# Patient Record
Sex: Male | Born: 1955 | Hispanic: No | Marital: Married | Smoking: Former smoker
Health system: Southern US, Community
[De-identification: ages and names within clinical notes are randomized; demographics above are authoritative.]

## PROBLEM LIST (undated history)

## (undated) DIAGNOSIS — G8929 Other chronic pain: Secondary | ICD-10-CM

## (undated) DIAGNOSIS — G4700 Insomnia, unspecified: Secondary | ICD-10-CM

## (undated) DIAGNOSIS — F32A Depression, unspecified: Secondary | ICD-10-CM

## (undated) DIAGNOSIS — F419 Anxiety disorder, unspecified: Secondary | ICD-10-CM

## (undated) DIAGNOSIS — F329 Major depressive disorder, single episode, unspecified: Secondary | ICD-10-CM

## (undated) DIAGNOSIS — R739 Hyperglycemia, unspecified: Secondary | ICD-10-CM

## (undated) DIAGNOSIS — M5441 Lumbago with sciatica, right side: Secondary | ICD-10-CM

## (undated) DIAGNOSIS — R413 Other amnesia: Secondary | ICD-10-CM

## (undated) DIAGNOSIS — I10 Essential (primary) hypertension: Secondary | ICD-10-CM

## (undated) DIAGNOSIS — E039 Hypothyroidism, unspecified: Secondary | ICD-10-CM

## (undated) HISTORY — DX: Depression, unspecified: F32.A

## (undated) HISTORY — DX: Essential (primary) hypertension: I10

## (undated) HISTORY — DX: Hypothyroidism, unspecified: E03.9

## (undated) HISTORY — DX: Lumbago with sciatica, right side: M54.41

## (undated) HISTORY — DX: Major depressive disorder, single episode, unspecified: F32.9

## (undated) HISTORY — DX: Other chronic pain: G89.29

## (undated) HISTORY — DX: Other amnesia: R41.3

## (undated) HISTORY — DX: Hyperglycemia, unspecified: R73.9

## (undated) HISTORY — DX: Insomnia, unspecified: G47.00

## (undated) HISTORY — DX: Anxiety disorder, unspecified: F41.9

---

## 2010-05-16 ENCOUNTER — Encounter
Admission: RE | Admit: 2010-05-16 | Discharge: 2010-05-16 | Payer: Self-pay | Source: Home / Self Care | Attending: Infectious Diseases | Admitting: Infectious Diseases

## 2013-04-25 ENCOUNTER — Encounter (HOSPITAL_COMMUNITY): Payer: Self-pay | Admitting: Emergency Medicine

## 2013-04-25 ENCOUNTER — Emergency Department (HOSPITAL_COMMUNITY)
Admission: EM | Admit: 2013-04-25 | Discharge: 2013-04-25 | Disposition: A | Discharge status: Home / Self Care | Payer: Medicaid Other | Attending: Emergency Medicine | Admitting: Emergency Medicine

## 2013-04-25 DIAGNOSIS — R220 Localized swelling, mass and lump, head: Secondary | ICD-10-CM | POA: Insufficient documentation

## 2013-04-25 DIAGNOSIS — Z87891 Personal history of nicotine dependence: Secondary | ICD-10-CM | POA: Insufficient documentation

## 2013-04-25 DIAGNOSIS — R599 Enlarged lymph nodes, unspecified: Secondary | ICD-10-CM | POA: Insufficient documentation

## 2013-04-25 DIAGNOSIS — K1379 Other lesions of oral mucosa: Secondary | ICD-10-CM

## 2013-04-25 DIAGNOSIS — H9209 Otalgia, unspecified ear: Secondary | ICD-10-CM | POA: Insufficient documentation

## 2013-04-25 DIAGNOSIS — K089 Disorder of teeth and supporting structures, unspecified: Secondary | ICD-10-CM | POA: Insufficient documentation

## 2013-04-25 DIAGNOSIS — J029 Acute pharyngitis, unspecified: Secondary | ICD-10-CM | POA: Insufficient documentation

## 2013-04-25 MED ORDER — PENICILLIN V POTASSIUM 500 MG PO TABS: 500.0000 mg | ORAL_TABLET | Freq: Three times a day (TID) | ORAL | Status: DC

## 2013-04-25 MED ORDER — HYDROCODONE-ACETAMINOPHEN 5-325 MG PO TABS: 1.0000 | ORAL_TABLET | Freq: Four times a day (QID) | ORAL | Status: DC | PRN

## 2013-04-25 NOTE — ED Notes (Signed)
Pt is here with right lower tooth pain, pain in left ear and hurts with biting for 3 days.  Reports sore throat too

## 2013-04-25 NOTE — ED Provider Notes (Signed)
CSN: [id number]     Arrival date & time 04/25/13  4098 History  This chart was scribed for non-physician practitioner Fayrene Helper, PA-C working with Laray Anger, DO by Leone Payor, ED Scribe. This patient was seen in room TR09C/TR09C and the patient's care was started at [phone].    Chief Complaint  Patient presents with  . Dental Pain    The history is provided by a relative (nephew). No language interpreter was used.    HPI Comments: Shawn Casey is a 57 y.o. male who presents to the Emergency Department complaining of 4 days of gradual onset, gradually worsening, constant right lower mouth pain. He has associated right ear pain and sore throat. His throat pain is worse with swallowing food but does not have difficulty doing this. He states the mouth pain is worse with applied pressure. He has not tried any OTC medications for these symptoms. He denies rhinorrhea, sneezing, fever, appetite change, chest pain, SOB, abdominal pain, nausea, vomiting, diarrhea.   History reviewed. No pertinent past medical history. History reviewed. No pertinent past surgical history. No family history on file. History  Substance Use Topics  . Smoking status: Former Games developer  . Smokeless tobacco: Not on file  . Alcohol Use: No    Review of Systems  Constitutional: Negative for fever and appetite change.  HENT: Positive for dental problem and sore throat. Negative for rhinorrhea and sneezing.   Respiratory: Negative for shortness of breath.   Cardiovascular: Negative for chest pain.  Gastrointestinal: Negative for nausea, vomiting and abdominal pain.    Allergies  Review of patient's allergies indicates no known allergies.  Home Medications  No current outpatient prescriptions on file. BP 151/90  Pulse 69  Temp(Src) 97.9 F (36.6 C) (Oral)  Resp 22  Ht 5' (1.524 m)  Wt 125 lb 14.4 oz (57.108 kg)  BMI 24.59 kg/m2  SpO2 96% Physical Exam  Nursing note and vitals  reviewed. Constitutional: He is oriented to person, place, and time. He appears well-developed and well-nourished.  HENT:  Head: Normocephalic and atraumatic.  Mouth/Throat: Uvula is midline and oropharynx is clear and moist. No trismus in the jaw. No oropharyngeal exudate, posterior oropharyngeal edema, posterior oropharyngeal erythema or tonsillar abscesses.  Partial partials to lower dentition. Mild edema noted to the right lower jaw with mild tenderness. No trismus, no obvious deep tissue infection in mouth.   Cardiovascular: Normal rate, regular rhythm and normal heart sounds.   Pulmonary/Chest: Effort normal and breath sounds normal. No respiratory distress. He has no wheezes. He has no rales.  Abdominal: He exhibits no distension.  Lymphadenopathy:    He has cervical adenopathy (minimal ).  Neurological: He is alert and oriented to person, place, and time.  Skin: Skin is warm and dry.  Psychiatric: He has a normal mood and affect.    ED Course  Procedures   DIAGNOSTIC STUDIES: Oxygen Saturation is 96% on RA, adequate by my interpretation.    COORDINATION OF CARE: 11:00 AM Return precautions given to nephew. Discussed treatment plan with pt at bedside and pt agreed to plan.   11:29 AM Pt has R jaw pain and mild edema.  Has poor dentition without obvious abscess, no trismus. Has partials with a sharp metal wire that can cause mucosal injury.  I suspect this may cause infection. Is afebrile, able to tolerates PO.  Plan to prescribe abx, pain medication, referral to oral surgeon as needed.  Return precaution discussed.    Labs Review Labs  Reviewed - No data to display Imaging Review No results found.  EKG Interpretation   None       MDM   1. Mouth pain    BP 151/90  Pulse 69  Temp(Src) 97.9 F (36.6 C) (Oral)  Resp 22  Ht 5' (1.524 m)  Wt 125 lb 14.4 oz (57.108 kg)  BMI 24.59 kg/m2  SpO2 96%  I personally performed the services described in this documentation,  which was scribed in my presence. The recorded information has been reviewed and is accurate.     Fayrene Helper, PA-C 04/25/13 1131

## 2013-04-25 NOTE — ED Provider Notes (Signed)
Medical screening examination/treatment/procedure(s) were performed by non-physician practitioner and as supervising physician I was immediately available for consultation/collaboration.  EKG Interpretation   None         Lataya Varnell M Honesty Menta, DO 04/25/13 1709 

## 2013-04-25 NOTE — ED Notes (Signed)
Pt c/o pain in right lower gum area and right ear.

## 2013-11-12 ENCOUNTER — Encounter (HOSPITAL_COMMUNITY): Payer: Self-pay | Admitting: Emergency Medicine

## 2013-11-12 ENCOUNTER — Emergency Department (HOSPITAL_COMMUNITY)
Admission: EM | Admit: 2013-11-12 | Discharge: 2013-11-12 | Disposition: A | Discharge status: Home / Self Care | Payer: Medicaid Other | Source: Home / Self Care | Attending: Family Medicine | Admitting: Family Medicine

## 2013-11-12 DIAGNOSIS — J069 Acute upper respiratory infection, unspecified: Secondary | ICD-10-CM

## 2013-11-12 DIAGNOSIS — M5430 Sciatica, unspecified side: Secondary | ICD-10-CM

## 2013-11-12 DIAGNOSIS — M5431 Sciatica, right side: Secondary | ICD-10-CM

## 2013-11-12 MED ORDER — TRAMADOL HCL 50 MG PO TABS: 50.0000 mg | ORAL_TABLET | Freq: Four times a day (QID) | ORAL | Status: DC | PRN

## 2013-11-12 MED ORDER — PREDNISONE 5 MG PO KIT: PACK | ORAL | Status: DC

## 2013-11-12 NOTE — ED Provider Notes (Signed)
Shawn Casey is a 58 y.o. male who presents to Urgent Care today for right low back pain radiating to the leg. This is been present for the last few days. No weakness or numbness bowel bladder dysfunction or difficulty walking. Patient also notes a mild cough and sore throat. No fevers or chills nausea vomiting or diarrhea. No trouble swallowing or breathing. No medications tried yet. No history of back pain. No injury history.   History reviewed. No pertinent past medical history. History  Substance Use Topics  . Smoking status: Former Research scientist (life sciences)  . Smokeless tobacco: Not on file  . Alcohol Use: No   ROS as above Medications: No current facility-administered medications for this encounter.   Current Outpatient Prescriptions  Medication Sig Dispense Refill  . PredniSONE 5 MG KIT 12 day Dosepak by mouth  1 kit  0  . traMADol (ULTRAM) 50 MG tablet Take 1 tablet (50 mg total) by mouth every 6 (six) hours as needed.  15 tablet  0    Exam:  BP 131/74  Pulse 60  Temp(Src) 98.5 F (36.9 C) (Oral)  Resp 14  SpO2 100% Gen: Well NAD HEENT: EOMI,  MMM posterior pharynx with cobblestoning. Tympanic membranes are normal appearing bilaterally. Lungs: Normal work of breathing. CTABL Heart: RRR no MRG Abd: NABS, Soft. NT, ND Exts: Brisk capillary refill, warm and well perfused.  Back: Nontender to spinal midline paraspinals or SI joints. Positive pretzel stretch on the right. Negative straight leg and Faber test bilaterally Neck range of motion is normal and intact. Largely strength is normal throughout. Reflexes are equal bilateral lower extremities. Normal gait.  No results found for this or any previous visit (from the past 24 hour(s)). No results found.  Assessment and Plan: 58 y.o. male with    sciatica. Plan to treat with prednisone dosepak. Additionally his tramadol for pain control as needed. Patient also has a viral URI. Watchful waiting. Followup with primary care  provider. Discussed warning signs or symptoms. Please see discharge instructions. Patient expresses understanding.    Gregor Hams, MD 11/12/13 1452

## 2013-11-12 NOTE — ED Notes (Signed)
C/o lower left sided back pain.  On set yesterday.  Denies urinary symptoms and injury.  No otc meds taken for symptoms.    Also c/o  Dry non productive cough, fever, and sore throat x 1 wk.

## 2013-11-12 NOTE — Discharge Instructions (Signed)
Thank you for coming in today. Come back or go to the emergency room if you notice new weakness new numbness problems walking or bowel or bladder problems. Do not take tramadol and drive or operate machinery  Sciatica Sciatica is pain, weakness, numbness, or tingling along the path of the sciatic nerve. The nerve starts in the lower back and runs down the back of each leg. The nerve controls the muscles in the lower leg and in the back of the knee, while also providing sensation to the back of the thigh, lower leg, and the sole of your foot. Sciatica is a symptom of another medical condition. For instance, nerve damage or certain conditions, such as a herniated disk or bone spur on the spine, pinch or put pressure on the sciatic nerve. This causes the pain, weakness, or other sensations normally associated with sciatica. Generally, sciatica only affects one side of the body. CAUSES   Herniated or slipped disc.  Degenerative disk disease.  A pain disorder involving the narrow muscle in the buttocks (piriformis syndrome).  Pelvic injury or fracture.  Pregnancy.  Tumor (rare). SYMPTOMS  Symptoms can vary from mild to very severe. The symptoms usually travel from the low back to the buttocks and down the back of the leg. Symptoms can include:  Mild tingling or dull aches in the lower back, leg, or hip.  Numbness in the back of the calf or sole of the foot.  Burning sensations in the lower back, leg, or hip.  Sharp pains in the lower back, leg, or hip.  Leg weakness.  Severe back pain inhibiting movement. These symptoms may get worse with coughing, sneezing, laughing, or prolonged sitting or standing. Also, being overweight may worsen symptoms. DIAGNOSIS  Your caregiver will perform a physical exam to look for common symptoms of sciatica. He or she may ask you to do certain movements or activities that would trigger sciatic nerve pain. Other tests may be performed to find the cause of  the sciatica. These may include:  Blood tests.  X-rays.  Imaging tests, such as an MRI or CT scan. TREATMENT  Treatment is directed at the cause of the sciatic pain. Sometimes, treatment is not necessary and the pain and discomfort goes away on its own. If treatment is needed, your caregiver may suggest:  Over-the-counter medicines to relieve pain.  Prescription medicines, such as anti-inflammatory medicine, muscle relaxants, or narcotics.  Applying heat or ice to the painful area.  Steroid injections to lessen pain, irritation, and inflammation around the nerve.  Reducing activity during periods of pain.  Exercising and stretching to strengthen your abdomen and improve flexibility of your spine. Your caregiver may suggest losing weight if the extra weight makes the back pain worse.  Physical therapy.  Surgery to eliminate what is pressing or pinching the nerve, such as a bone spur or part of a herniated disk. HOME CARE INSTRUCTIONS   Only take over-the-counter or prescription medicines for pain or discomfort as directed by your caregiver.  Apply ice to the affected area for 20 minutes, 3 4 times a day for the first 48 72 hours. Then try heat in the same way.  Exercise, stretch, or perform your usual activities if these do not aggravate your pain.  Attend physical therapy sessions as directed by your caregiver.  Keep all follow-up appointments as directed by your caregiver.  Do not wear high heels or shoes that do not provide proper support.  Check your mattress to see if  it is too soft. A firm mattress may lessen your pain and discomfort. SEEK IMMEDIATE MEDICAL CARE IF:   You lose control of your bowel or bladder (incontinence).  You have increasing weakness in the lower back, pelvis, buttocks, or legs.  You have redness or swelling of your back.  You have a burning sensation when you urinate.  You have pain that gets worse when you lie down or awakens you at  night.  Your pain is worse than you have experienced in the past.  Your pain is lasting longer than 4 weeks.  You are suddenly losing weight without reason. MAKE SURE YOU:  Understand these instructions.  Will watch your condition.  Will get help right away if you are not doing well or get worse. Document Released: 05/20/2001 Document Revised: 11/25/2011 Document Reviewed: 10/05/2011 [facility] Patient Information 2014 [employer], [place].

## 2014-02-23 ENCOUNTER — Encounter (HOSPITAL_COMMUNITY): Payer: Self-pay | Admitting: Emergency Medicine

## 2014-02-23 ENCOUNTER — Emergency Department (HOSPITAL_COMMUNITY)
Admission: EM | Admit: 2014-02-23 | Discharge: 2014-02-23 | Disposition: A | Discharge status: Nursing Home (Includes ICF) | Payer: Self-pay | Source: Home / Self Care | Attending: Emergency Medicine | Admitting: Emergency Medicine

## 2014-02-23 ENCOUNTER — Observation Stay (HOSPITAL_COMMUNITY)
Admission: EM | Admit: 2014-02-23 | Discharge: 2014-02-24 | Disposition: A | Discharge status: Home / Self Care | Payer: Self-pay | Attending: Internal Medicine | Admitting: Internal Medicine

## 2014-02-23 ENCOUNTER — Emergency Department (HOSPITAL_COMMUNITY): Payer: Self-pay

## 2014-02-23 DIAGNOSIS — R079 Chest pain, unspecified: Secondary | ICD-10-CM

## 2014-02-23 DIAGNOSIS — R0789 Other chest pain: Principal | ICD-10-CM | POA: Insufficient documentation

## 2014-02-23 DIAGNOSIS — Z87891 Personal history of nicotine dependence: Secondary | ICD-10-CM | POA: Insufficient documentation

## 2014-02-23 DIAGNOSIS — L509 Urticaria, unspecified: Secondary | ICD-10-CM

## 2014-02-23 DIAGNOSIS — R072 Precordial pain: Secondary | ICD-10-CM

## 2014-02-23 LAB — CBC WITH DIFFERENTIAL/PLATELET
BASOS ABS: 0 10*3/uL (ref 0.0–0.1)
Basophils Relative: 0 % (ref 0–1)
EOS PCT: 0 % (ref 0–5)
Eosinophils Absolute: 0 10*3/uL (ref 0.0–0.7)
HCT: 40.3 % (ref 39.0–52.0)
HEMOGLOBIN: 13.7 g/dL (ref 13.0–17.0)
LYMPHS ABS: 1.1 10*3/uL (ref 0.7–4.0)
Lymphocytes Relative: 20 % (ref 12–46)
MCH: 27.5 pg (ref 26.0–34.0)
MCHC: 34 g/dL (ref 30.0–36.0)
MCV: 80.8 fL (ref 78.0–100.0)
MONO ABS: 0.2 10*3/uL (ref 0.1–1.0)
MONOS PCT: 3 % (ref 3–12)
NEUTROS ABS: 4 10*3/uL (ref 1.7–7.7)
Neutrophils Relative %: 77 % (ref 43–77)
Platelets: 148 10*3/uL — ABNORMAL LOW (ref 150–400)
RBC: 4.99 MIL/uL (ref 4.22–5.81)
RDW: 13 % (ref 11.5–15.5)
WBC: 5.2 10*3/uL (ref 4.0–10.5)

## 2014-02-23 LAB — I-STAT CHEM 8, ED
BUN: 10 mg/dL (ref 6–23)
CHLORIDE: 109 meq/L (ref 96–112)
Calcium, Ion: 1.12 mmol/L — ABNORMAL LOW (ref 1.13–1.30)
Creatinine, Ser: 1.1 mg/dL (ref 0.50–1.35)
Glucose, Bld: 88 mg/dL (ref 70–99)
HCT: 41 % (ref 39.0–52.0)
HEMOGLOBIN: 13.9 g/dL (ref 13.0–17.0)
POTASSIUM: 3.8 meq/L (ref 3.7–5.3)
Sodium: 142 mEq/L (ref 137–147)
TCO2: 23 mmol/L (ref 0–100)

## 2014-02-23 LAB — I-STAT TROPONIN, ED: Troponin i, poc: 0 ng/mL (ref 0.00–0.08)

## 2014-02-23 LAB — TROPONIN I: Troponin I: <0.3 ng/mL (ref <0.30)

## 2014-02-23 MED ORDER — DIPHENHYDRAMINE HCL 25 MG PO CAPS
50.0000 mg | ORAL_CAPSULE | Freq: Once | ORAL | Status: AC
  Administered 2014-02-23: 50 mg via ORAL

## 2014-02-23 MED ORDER — SODIUM CHLORIDE 0.9 % IV SOLN
INTRAVENOUS | Status: DC
  Administered 2014-02-23: 11:00:00 via INTRAVENOUS

## 2014-02-23 MED ORDER — RANITIDINE HCL 150 MG/10ML PO SYRP
150.0000 mg | ORAL_SOLUTION | Freq: Two times a day (BID) | ORAL | Status: DC
  Administered 2014-02-23 – 2014-02-24 (×2): 150 mg via ORAL
  Filled 2014-02-23 (×3): qty 10

## 2014-02-23 MED ORDER — DIPHENHYDRAMINE HCL 25 MG PO CAPS
ORAL_CAPSULE | ORAL | Status: AC
  Filled 2014-02-23: qty 2

## 2014-02-23 MED ORDER — INFLUENZA VAC SPLIT QUAD 0.5 ML IM SUSY
0.5000 mL | PREFILLED_SYRINGE | INTRAMUSCULAR | Status: DC
  Filled 2014-02-23: qty 0.5

## 2014-02-23 MED ORDER — ASPIRIN 81 MG PO CHEW
324.0000 mg | CHEWABLE_TABLET | Freq: Once | ORAL | Status: AC
  Administered 2014-02-23: 324 mg via ORAL

## 2014-02-23 MED ORDER — GI COCKTAIL ~~LOC~~
30.0000 mL | Freq: Once | ORAL | Status: AC
  Administered 2014-02-23: 30 mL via ORAL
  Filled 2014-02-23: qty 30

## 2014-02-23 MED ORDER — CETIRIZINE HCL 5 MG/5ML PO SYRP
5.0000 mg | ORAL_SOLUTION | Freq: Every day | ORAL | Status: DC
  Administered 2014-02-23 – 2014-02-24 (×2): 5 mg via ORAL
  Filled 2014-02-23 (×2): qty 5

## 2014-02-23 MED ORDER — ASPIRIN 81 MG PO CHEW
CHEWABLE_TABLET | ORAL | Status: AC
  Filled 2014-02-23: qty 4

## 2014-02-23 MED ORDER — NITROGLYCERIN 0.4 MG SL SUBL
0.4000 mg | SUBLINGUAL_TABLET | SUBLINGUAL | Status: AC | PRN
  Administered 2014-02-23: 0.4 mg via SUBLINGUAL

## 2014-02-23 MED ORDER — NITROGLYCERIN 0.4 MG SL SUBL
SUBLINGUAL_TABLET | SUBLINGUAL | Status: AC
  Filled 2014-02-23: qty 1

## 2014-02-23 MED ORDER — HEPARIN SODIUM (PORCINE) 5000 UNIT/ML IJ SOLN
5000.0000 [IU] | Freq: Three times a day (TID) | INTRAMUSCULAR | Status: DC
  Administered 2014-02-23 – 2014-02-24 (×3): 5000 [IU] via SUBCUTANEOUS
  Filled 2014-02-23 (×5): qty 1

## 2014-02-23 MED ORDER — ASPIRIN EC 81 MG PO TBEC
81.0000 mg | DELAYED_RELEASE_TABLET | Freq: Every day | ORAL | Status: DC
  Administered 2014-02-24: 81 mg via ORAL
  Filled 2014-02-23: qty 1

## 2014-02-23 MED ORDER — METHYLPREDNISOLONE SODIUM SUCC 125 MG IJ SOLR
60.0000 mg | Freq: Once | INTRAMUSCULAR | Status: AC
  Administered 2014-02-23: 60 mg via INTRAVENOUS
  Filled 2014-02-23: qty 0.96

## 2014-02-23 NOTE — ED Notes (Signed)
Pt  Has   A  Rash  Red  Raised    Lesions     On  Torso    That  Itch  He  Also  Reports  Chest  Pain     He  Is  Awake  Alert   And  Oriented

## 2014-02-23 NOTE — H&P (Signed)
Date: 02/23/2014               Patient Name:  Shawn Casey MRN: [id number]  DOB: 1956/04/09 Age / Sex: 58 y.o., male   PCP: No Pcp Per Patient              Medical Service: Internal Medicine Teaching Service              Attending Physician: Dr. Burns Spain, MD    First Contact: Juliane Lack, MS 4 Pager: [phone]  Second Contact: Dr. Sherrine Maples Pager: [phone]  Third Contact  Pager:        After Hours (After 5p/  First Contact Pager: [phone]  weekends / holidays): Second Contact Pager: [phone]   Chief Complaint: Chief Complaint  Patient presents with  . Urticaria  . Chest Pain   Chest pain  HPI: Shawn Casey is a 51 [place] man (moved to [place] 2011, doesn't speak english, son can translate) who presents with episode of exertional CP yesterday and diffuse extremity rash x1d. Reports that yesterday had onset of upper and lower extremity itching with associated "swelling." Denies localized raised areas, endorses redness only where he scratched but otherwise no rash noted. Denies known allergies. Denies any difficulty with breathing, tongue swelling, lip swelling. Has been slowly resolving and notes that it is the best that is has been at time of this writer's interview.  Also concerning to him recently was an episode of chest pain. 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 Reports that he was exerting himself and had onset of achy pain at the base of his sternum that did not radiate, associated with dizziness, sweating, and shortness of breath. Sat down to rest and pain reportedly got worse over first 15 min then eased off and was gone by 30 min along with resolving dizziness and SOB. Denies history of this pain in the past. Reports that he also had some intermittent sharp chest pains after that episode with deep breathing. Denies history of MI or cardiac issues. Has no other medical problems. At  time of this interview, denies any chest pain.  Allergies: Review of patient's allergies indicates no known allergies.  Medications:  Current Facility-Administered Medications  Medication Dose Route Frequency Provider Last Rate Last Dose  . [START ON 02/24/2014] aspirin EC tablet 81 mg  81 mg Oral Daily Genelle Gather, MD      . cetirizine HCl (Zyrtec) 5 MG/5ML syrup 5 mg  5 mg Oral Daily Genelle Gather, MD   5 mg at 02/23/14 1802  . heparin injection 5,000 Units  5,000 Units Subcutaneous 3 times per day Genelle Gather, MD   5,000 Units at 02/23/14 1802  . [START ON 02/24/2014] Influenza vac split quadrivalent PF (FLUARIX) injection 0.5 mL  0.5 mL Intramuscular Tomorrow-1000 Burns Spain, MD      . ranitidine (ZANTAC) 150 MG/10ML syrup 150 mg  150 mg Oral BID Genelle Gather, MD   150 mg at 02/23/14 1801    Medical History: History reviewed. No pertinent past medical history.  Surgical History: History reviewed. No pertinent past surgical history.  Social History: History   Social History  . Marital Status: Married    Spouse Name: N/A    Number of Children: N/A  . Years of Education: N/A   Occupational History  . Not on file.   Social History Main Topics  . Smoking  status: Former Games developer  . Smokeless tobacco: Not on file  . Alcohol Use: No  . Drug Use: No  . Sexual Activity: Not Currently   Other Topics Concern  . Not on file   Social History Narrative  . No narrative on file    Family History: History reviewed. No pertinent family history.  Review of Systems: 10 systems reviewed and are negative unless otherwise mentioned in HPI.  Labs/Studies: labs and studies reviewed per EMR.   CBC    Component Value Date/Time   WBC 5.2 02/23/2014 1237   RBC 4.99 02/23/2014 1237   HGB 13.9 02/23/2014 1337   HCT 41.0 02/23/2014 1337   PLT 148* 02/23/2014 1237   MCV 80.8 02/23/2014 1237   MCH 27.5 02/23/2014 1237   MCHC 34.0 02/23/2014 1237   RDW 13.0 02/23/2014 1237     LYMPHSABS 1.1 02/23/2014 1237   MONOABS 0.2 02/23/2014 1237   EOSABS 0.0 02/23/2014 1237   BASOSABS 0.0 02/23/2014 1237     Physical Exam: General: 58 year old man laying on bed wearing beanie on head in NAD, son and wife at bedside Skin: localized slightly erythematous wheels on back and posterior aspect of arm HEENT: NCAT, EOMI Cardiovascular: no TTP along chest wall, pulse regular rate and rhythm, normal S1S2, no murmurs appreciated Pulmonary: Scant bibasilar fine crackles that clear mostly after a few deep inspirations, otherwise clear, normal depth and effort, normal WOB Abdomen: Soft, NTND, +BS, no masses appreciated  Extremities: Warm and well perfused, no clubbing/cyanosis/edema Neuro: CN III-XII grossly intact, spontaneous movement of all limbs, no obvious deficits  Assessment/Plan:  Principal Problem:   Chest pain Active Problems:   Urticaria   Shawn Casey is a 58 y.o. male with no reported PMH that p/w 1 day itching extremities associated with swelling and some redness as well as episode of chest pain.   Chest pain: Pt description of pain fulfills criteria for unstable angina. Was exertional, substernal, associated with dyspnea, and relieved with rest. See HPI for full description. Trops negative x1 ED, EKG without signs of ischemia. Location of pain is inferior sternum, however could also be GI in nature, received GI cocktail in ED.  - tele - f/u cycled trops q6 x3  Urticaria of unknown origin: Pt reports no known allergies. Reports one day of extremity itchiness with "swelling," but no rash, wheals; only red where pt scratched. Unlikely that this is related to pts CP. Began to resolve s/p benadryl at urgent care. On exam, found to have some erythematous circular wheals on back and posterior R upper arm.  - Zyretc  daily  - Zantac  BID  - Solumedrol     This is a Psychologist, occupational Note.  The care of the patient was discussed with Dr. Sherrine Maples  and the assessment and plan was formulated with their assistance.  Please see their note for official documentation of the patient encounter.

## 2014-02-23 NOTE — ED Provider Notes (Signed)
CSN: [id number]     Arrival date & time 02/23/14  1201 History   First MD Initiated Contact with Patient 02/23/14 1208     No chief complaint on file.    (Consider location/radiation/quality/duration/timing/severity/associated sxs/prior Treatment) HPI  58 year old [place] male who was sent here from urgent care for evaluations of chest pain. Patient speaks Nepali. History obtained through [facility] phone interpreter. Patient is a poor historian, difficult to obtain history. [place]. He works night shift. Last night around 5 PM patient developed an urticarial rash follows with chest discomfort and headache. Report CP worse with exertion but no associated SOB, diaphoresis, lightheadedness. He continues finishing his night shift and subsequently went to urgent care for evaluation. As far as he knows he does not recall any new environmental exposure, change in medication, soap or detergent. He was seen at urgent care earlier today and did received Benadryl, in addition to sublingual nitroglycerin and aspirin. Medication did improve his rash but he was sent here for further evaluation of his chest pain. Patient unable to describe his chest pain but point to his mid chest. He denies any shortness of breath diaphoresis nausea vomiting, lightheadedness, dizziness that is associated with his chest pain. He cannot tell me if he had similar pain in the past. He is a nonsmoker and denies any significant cardiac history. Patient does not take any medication on a regular basis. At this time he still endorsed some chest pain but states that has improved. Pain is been ongoing for more than 12 hours.  No past medical history on file. No past surgical history on file. No family history on file. History  Substance Use Topics  . Smoking status: Former Research scientist (life sciences)  . Smokeless tobacco: Not on file  . Alcohol Use: No    Review of Systems  All other systems  reviewed and are negative.     Allergies  Review of patient's allergies indicates no known allergies.  Home Medications   Prior to Admission medications   Medication Sig Start Date End Date Taking? Authorizing Provider  PredniSONE 5 MG KIT 12 day Dosepak by mouth 11/12/13   Gregor Hams, MD  traMADol (ULTRAM) 50 MG tablet Take 1 tablet (50 mg total) by mouth every 6 (six) hours as needed. 11/12/13   Gregor Hams, MD   There were no vitals taken for this visit. Physical Exam  Constitutional: He is oriented to person, place, and time. He appears well-developed and well-nourished. No distress.  HENT:  Head: Atraumatic.  Mouth/Throat: Oropharynx is clear and moist.  No mucosal edema or airway compromise  Eyes: Conjunctivae are normal.  Eyelids are mildly edematous  Neck: Normal range of motion. Neck supple.  Cardiovascular: Normal rate and regular rhythm.   Pulmonary/Chest: Effort normal and breath sounds normal. He has no wheezes. He exhibits no tenderness.  Abdominal: Soft. There is no tenderness.  Musculoskeletal: He exhibits no edema.  Neurological: He is alert and oriented to person, place, and time.  Skin: No rash (Faint urticarial rash throughout body but improving) noted.  Psychiatric: He has a normal mood and affect.    ED Course  Procedures (including critical care time)  12:45 PM Patient here with nonspecific chest pain corresponding with his urticarial rash that is improving. Pain is atypical for ACS, however given his age and a poor historian, work up initiated.    2:03 PM Patient has a  normal EKG, negative troponin and currently chest pain-free. Chest x-ray, and labs are reassuring. Pain is atypical for ACS. Care discussed with Dr. Betsey Holiday.  Plan to ambulate pt and if he's pain free, will d/c pt with outpt f/u.    2:33 PM When ambulating pt reports having mild chest discomfort, but sxs free while resting.  No c/o SOB.  I have consulted with Internal medicine  resident, who agrees to see pt and will admit for cp rule out.     Labs Review Labs Reviewed  CBC WITH DIFFERENTIAL - Abnormal; Notable for the following:    Platelets 148 (*)    All other components within normal limits  I-STAT CHEM 8, ED - Abnormal; Notable for the following:    Calcium, Ion 1.12 (*)    All other components within normal limits  Randolm Idol, ED    Imaging Review Dg Chest 2 View  02/23/2014   CLINICAL DATA:  Chest pain.  EXAM: CHEST  2 VIEW  COMPARISON:  05/16/2010  FINDINGS: The cardiomediastinal silhouette is within normal limits. The lungs are well inflated and clear. There is no evidence of pleural effusion or pneumothorax. No acute osseous abnormality is identified.  IMPRESSION: No active cardiopulmonary disease.   Electronically Signed   By: Logan Bores   On: 02/23/2014 13:45     EKG Interpretation   Date/Time:  Thursday February 23 2014 12:25:48 EDT Ventricular Rate:  76 PR Interval:  140 QRS Duration: 80 QT Interval:  349 QTC Calculation: 392 R Axis:   55 Text Interpretation:  Sinus rhythm Normal ECG Confirmed by POLLINA  MD,  CHRISTOPHER ([id number]) on 02/23/2014 12:29:52 PM      MDM   Final diagnoses:  Urticaria  Exertional chest pain    BP 90/60  Pulse 60  Temp(Src) 98.4 F (36.9 C) (Oral)  Resp 18  SpO2 96%  I have reviewed nursing notes and vital signs. I personally reviewed the imaging tests through PACS system  I reviewed available ER/hospitalization records thought the EMR     Domenic Moras, [place] 02/23/14 1434

## 2014-02-23 NOTE — ED Notes (Signed)
20  Angio    r  Arm  1  Att     Site  patent

## 2014-02-23 NOTE — H&P (Signed)
Date: 02/23/2014               Patient Name:  Shawn Casey MRN: [id number]  DOB: 10/29/1955 Age / Sex: 58 y.o., male   PCP: No Pcp Per Patient         Medical Service: Internal Medicine Teaching Service         Attending Physician: Dr. Burns Spain, MD    First Contact: Juliane Lack, MS-IV Pager: [phone]  Second Contact: Dr. Sherrine Maples Pager: [phone]       After Hours (After 5p/  First Contact Pager: [phone]  weekends / holidays): Second Contact Pager: [phone]   Chief Complaint: Urticaria and chest pain  History of Present Illness:  Pt is a 58yo M w/ no PMH who presents to the ED from Urgent Care with hives and an episode of chest pain. His son is present and speaks fluent [place] and is able to translate for the patient who is from [place] and does not speak [place]. 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 He is unable to qualify the pain, but states that he sat down and the pain and dizziness resolved after 10-15 minutes. He denies any further pain since that time; however ED documentation states that the patient was having chest pain in the ED while ambulating to the bathroom.   Per the son, when the patient came home from work around [employer], he was in his normal state of health. However, when he awoke this morning he was itching all over with swelling in his BUE and BLE, denied hives, but did feel as though his throat was swelling. He was taken to Urgent Care by his family where he was noted to have hives but no edema or respiratory symptoms. He was given Benadryl, ASA, NTG and then transferred to [facility] in stable condition. Once in the ED, his itching had almost resolved. The patient and the family deny any chemical exposures, changes in detergents or soaps, or changes in po consumption.    Meds: No  current facility-administered medications for this encounter.   No current outpatient prescriptions on file.    Allergies: Allergies as of 02/23/2014  . (No Known Allergies)   History reviewed. No pertinent past medical history. History reviewed. No pertinent past surgical history. History reviewed. No pertinent family history. History   Social History  . Marital Status: Married    Spouse Name: N/A    Number of Children: N/A  . Years of Education: N/A   Occupational History  . Not on file.   Social History Main Topics  . Smoking status: Former Games developer  . Smokeless tobacco: Not on file  . Alcohol Use: No  . Drug Use: No  . Sexual Activity: Not on file   Other Topics Concern  . Not on file   Social History Narrative  . No narrative on file    Review of Systems: A 12 point ROS was performed; pertinent positives and negatives were noted in the HPI   Physical Exam: Blood pressure 118/71, pulse 64, temperature 98.4 F (36.9 C), temperature source Oral, resp. rate 16, SpO2 96.00%. General: Alert & oriented x 3, well-developed, and cooperative on examination.  Head: Normocephalic and atraumatic.  Eyes: Vision grossly intact, pupils equal, round, and reactive to light  Neck: Supple, full ROM,  no JVD.  Lungs: CTAB, normal respiratory effort, no accessory muscle use, no crackles, and no wheezes. Heart: Regular rate, regular rhythm, no murmur, no gallop, and no rub.  Abdomen: Soft, non-tender, non-distended, normal bowel sounds, no guarding, no rebound tenderness, no organomegaly.  Msk: No joint swelling, warmth, or erythema.  Extremities: No peripheral edema Neurologic: Alert & oriented X3, cranial nerves II-XII intact, strength normal in all extremities, sensation intact. Gait not observed Skin: 2 5mm raised, erythematous lesions on left lateral back with erythema extending across the lower back fields. 1cm raised erythematous area on right posterior arm superior to elbow.    Psych: Memory intact for recent and remote. Poor eye contact.   Lab results: Basic Metabolic Panel:  Recent Labs  53/66/44 1337  NA 142  K 3.8  CL 109  GLUCOSE 88  BUN 10  CREATININE 1.10   CBC:  Recent Labs  02/23/14 1237 02/23/14 1337  WBC 5.2  --   NEUTROABS 4.0  --   HGB 13.7 13.9  HCT 40.3 41.0  MCV 80.8  --   PLT 148*  --     Imaging results:  Dg Chest 2 View  02/23/2014   CLINICAL DATA:  Chest pain.  EXAM: CHEST  2 VIEW  COMPARISON:  05/16/2010  FINDINGS: The cardiomediastinal silhouette is within normal limits. The lungs are well inflated and clear. There is no evidence of pleural effusion or pneumothorax. No acute osseous abnormality is identified.  IMPRESSION: No active cardiopulmonary disease.   Electronically Signed   By: Sebastian Ache   On: 02/23/2014 13:45    Other results: EKG: Normal sinus rhythm. No priors for comparison.  Assessment & Plan by Problem: Pt is a 58yo M w/ no PMH who presents to the ED from Urgent Care with hives and an episode of chest pain and urticaria.  Chest pain: 1 episode of chest pain yesterday that resolved with rest after 10-15 minutes and was associated with dizziness. Reported chest pain with ambulation in the ED, but with the patient's language barrier, unsure if he was actually experiencing chest pain, as he told us, through his son, that he has had no more pain since yesterday. EKG in normal sinus rhythm without any ST changes. Troponin negative x1. Pain location seems to be more in midepigastrium and could be related to GERD, although the patient denies any GI symptoms. A GI cocktail was administered in the ED. Possible that the pain could be related to his urticaria/allergic reaction, but it did occur hours before the onset of his skin manifestations. IMTS asked to admit for chest pain rule out - Admit to IMTS to telemetry - Troponins q6 x3 - Vitals per unit protocol - Regular diet  Urticaria, possibly idiopathic: Pt awoke  with itching across torso, back and extremtiies. Pt and family deny any hives, but do endorse swelling in bilateral upper and lower extremities. Pt also reports swelling in the throat. He presented to Urgent Care where hives were documented on exam and was given Benadryl  x1, ASA, and NTG (no report of chest pain at the Urgent Care). In the ED, patient with few scattered urticarial lesions on left back and R arm. Denies any current respiratory symptoms.  - Start Zyretc  daily - Start Zantac  BID - Start Solumedrol  x1 to help prevent biphasic reaction  DVT PPx: [place] Heparin    Dispo: Disposition is deferred at this time, awaiting improvement of current medical problems. Anticipated discharge in approximately  1-2 day(s).   The patient does not have a current PCP (No Pcp Per Patient) and may need an [facility] hospital follow-up appointment after discharge.  The patient does not have transportation limitations that hinder transportation to clinic appointments.  Signed: Genelle Gather, MD 02/23/2014, 4:16 PM

## 2014-02-23 NOTE — ED Notes (Signed)
Pt reporting chest pain when ambulating to the restroom.

## 2014-02-23 NOTE — ED Provider Notes (Signed)
Chief Complaint   Rash   History of Present Illness    Shawn Casey is a 58 year old male who was at work last night when he developed a generalized urticarial rash on his face, trunk, and extremities, sparing the palms and soles. He does not have any mucosal lesions. This is very itchy. At the same time he developed substernal chest pain without radiation. The pain has persisted up until today as have the hives. He denies any shortness of breath, coughing, or wheezing. He's had no nausea or diaphoresis. He has no prior history of chest pain and denies any risk factors although he is a former smoker.  Review of Systems    Other than noted above, the patient denies any of the following symptoms. Systemic:  No fever or chills. Pulmonary:  No cough, wheezing, shortness of breath, sputum production, hemoptysis. Cardiac:  No palpitations, rapid heartbeat, dizziness, presyncope or syncope. GI:  No abdominal pain, heartburn, nausea, or vomiting. Ext:  No leg pain or swelling.  PMFSH    Past medical history, family history, social history, meds, and allergies were reviewed.   Physical Exam     Vital signs:  BP 134/78  Pulse 80  Temp(Src) 98.6 F (37 C) (Oral)  Resp 18  SpO2 100% Gen:  Alert, oriented, in no distress, skin warm and dry. ENT:  Mucous membranes moist, pharynx clear. Neck:  Supple, no adenopathy or tenderness.  No JVD. Lungs:  Clear to auscultation, no wheezes, rales or rhonchi.  No respiratory distress. Heart:  Regular rhythm.  No gallops, murmers, clicks or rubs. Chest:  No chest wall tenderness. Abdomen:  Soft, nontender, no organomegaly or mass.  Bowel sounds normal.  No pulsatile abdominal mass or bruit. Ext:  No edema.  No calf tenderness and Homann's sign negative.  Pulses full and equal. Skin:  Warm and dry.  He has a generalized, urticarial rash over his entire body.  EKG Results:  Date: 02/23/2014  Rate: 85  Rhythm: normal sinus rhythm  QRS Axis: normal--7  Intervals: normal--QTc interval 392 ms  ST/T Wave abnormalities: normal  Conduction Disutrbances:none  Narrative Interpretation: Normal sinus rhythm, normal EKG  Old EKG Reviewed: none available    Course in Urgent Care Center   The following medications were given:  Medications  aspirin chewable tablet 324 mg (not administered)  nitroGLYCERIN (NITROSTAT) SL tablet 0.4 mg (not administered)  0.9 %  sodium chloride infusion (not administered)  diphenhydrAMINE (BENADRYL) capsule 50 mg (not administered)                                                                                                                                      Assessment     The primary encounter diagnosis was Urticaria. A diagnosis of Chest pain, unspecified chest pain type was also pertinent to this visit.  Differential diagnosis is acute coronary syndrome, pulmonary embolism, ruptured aneurysm, pneumothorax, Boerhaave  syndrome, pericarditis, musculoskeletal pain, or reflux esophagitis.   Plan     The patient was transferred to the ED via EMS in stable condition.  Medical Decision Making:  58 year old [place] male has a history of hives since last night.  No new foods or meds.  No difficulty breathing.  He also has substernal chest pain since last night.  No shortness of breath, nausea, or diaphoresis.  No prior history of heart disease or risk factors.  We  Have given PO Benadryl plus TNG and ASA and will transfer via [facility].       Reuben Likes, MD 02/23/14 1106

## 2014-02-23 NOTE — ED Notes (Signed)
Patient transported to X-ray 

## 2014-02-23 NOTE — ED Notes (Signed)
Pt here from urgent care with c/o urticaria and cp. Pt speaks no english, and will answer yes to all questions. Pt is alert at this time. VSS, NAD noted.

## 2014-02-23 NOTE — Discharge Instructions (Signed)
We have determined that your problem requires further evaluation in the emergency department.  We will take care of your transport there.  Once at the emergency department, you will be evaluated by a provider and they will order whatever treatment or tests they deem necessary.  We cannot guarantee that they will do any specific test or do any specific treatment.  ° °

## 2014-02-23 NOTE — Progress Notes (Signed)
Pt denies C/P on arrival to floor.

## 2014-02-23 NOTE — ED Provider Notes (Signed)
Medical screening examination/treatment/procedure(s) were conducted as a shared visit with non-physician practitioner(s) and myself.  I personally evaluated the patient during the encounter.   EKG Interpretation   Date/Time:  Thursday February 23 2014 12:25:48 EDT Ventricular Rate:  76 PR Interval:  140 QRS Duration: 80 QT Interval:  349 QTC Calculation: 392 R Axis:   55 Text Interpretation:  Sinus rhythm Normal ECG Confirmed by Kennan Detter  MD,  Camyah Pultz [phone]) on 02/23/2014 12:29:52 PM      Present to the ER for evaluation of acute onset of rash and chest pain, both of which began last night. Patient had first noticed an itchy rash diffusely. No obvious are identified. He then developed chest pain that has been exertional in nature. He did have resolution of pain at one point in the ER, but after he got up and ambulated pain returned. Based on this, will admit for further workup.  Gilda Crease, MD 02/23/14 [phone]

## 2014-02-24 DIAGNOSIS — I369 Nonrheumatic tricuspid valve disorder, unspecified: Secondary | ICD-10-CM

## 2014-02-24 DIAGNOSIS — R072 Precordial pain: Secondary | ICD-10-CM

## 2014-02-24 LAB — LIPID PANEL
CHOL/HDL RATIO: 5.6 ratio
Cholesterol: 158 mg/dL (ref 0–200)
HDL: 28 mg/dL — AB (ref >39)
LDL CALC: 102 mg/dL — AB (ref 0–99)
TRIGLYCERIDES: 138 mg/dL (ref <150)
VLDL: 28 mg/dL (ref 0–40)

## 2014-02-24 LAB — TROPONIN I: Troponin I: <0.3 ng/mL (ref <0.30)

## 2014-02-24 MED ORDER — ASPIRIN 81 MG PO TBEC: 81.0000 mg | DELAYED_RELEASE_TABLET | Freq: Every day | ORAL | Status: DC

## 2014-02-24 NOTE — Progress Notes (Signed)
I have seen the patient and reviewed the daily progress note by Juliane Lack. MS IV and discussed the care of the patient with him.  See below for documentation of my findings, assessment, and plans.  Subjective: Pt doing well this morning. He denies any SOB, chest pain, or further itching.   Objective: Vital signs in last 24 hours: Filed Vitals:   02/23/14 1528 02/23/14 1629 02/23/14 2111 02/24/14 0541  BP: 118/71 113/63 109/67 114/58  Pulse: 64 50 79 56  Temp:  98.2 F (36.8 C) 97.7 F (36.5 C) 97.7 F (36.5 C)  TempSrc:  Oral Oral Oral  Resp: Weight:    123 lb 4.8 oz (55.929 kg)  SpO2: 96% 97% 98% 100%   Weight change:   Intake/Output Summary (Last 24 hours) at 02/24/14 1303 Last data filed at 02/24/14 0542  Gross per 24 hour  Intake    480 ml  Output      0 ml  Net    480 ml   Vitals reviewed. General: Lying in bed, NAD HEENT: PERRL, EOMI, no scleral icterus Cardiac: RRR, no rubs, murmurs or gallops Pulm: Mild crackles at the right lung base Abd: soft, nontender, nondistended, BS present Ext: warm and well perfused, no pedal edema Neuro: alert and oriented X3, cranial nerves II-XII grossly intact, moves all 4 extremities, no gross neurological deficits.   Lab Results: Reviewed and documented in Electronic Record Micro Results: Reviewed and documented in Electronic Record Studies/Results: Reviewed and documented in Electronic Record  Medications: I have reviewed the patient's current medications. Scheduled Meds: . aspirin EC  81 mg Oral Daily  . cetirizine HCl  5 mg Oral Daily  . heparin  5,000 Units Subcutaneous 3 times per day  . Influenza vac split quadrivalent PF  0.5 mL Intramuscular Tomorrow-1000  . ranitidine  150 mg Oral BID   Continuous Infusions:  PRN Meds:.  Assessment/Plan: Pt is a 58yo M w/ no PMH who presents to the ED from Urgent Care with hives and an episode of chest pain and urticaria.   Chest pain: 1 episode of chest  pain yesterday that resolved with rest after 10-15 minutes and was associated with dizziness. EKG in normal sinus rhythm without any ST changes. Troponin negative x3. Pain location seems to be more in midepigastrium and could be related to GERD, although the patient denies any GI symptoms. A GI cocktail was administered in the ED. Tele without overnight events. Unsure if this was cardiac in natures, but Cardiology was consulted by nursing on the floor for further evaluation. Cards recommended a stress ECHO, and if it is negative he will not require further intervention.  - F/u stress ECHO - Lipid panel pending  Urticaria, possibly idiopathic: Resolved. Pt presented to Urgent Care with urticaria 9/17 where he was given given Benadryl  x1, ASA, and NTG (no report of chest pain at the Urgent Care). In the ED, patient with few scattered urticarial lesions on left back and R arm. Zyrtec, Zantac, and Solumedrol  IV x1 were administered overnight and today he is without urticaria or itching.    DVT PPx: [place] Heparin   Dispo: D/c to home today if stress ECHO   The patient does not have a current PCP (No Pcp Per Patient) and does not know need an [facility] hospital follow-up appointment after discharge, but does request an appointment at [facility] where his wife is a patient.  The patient does  not have transportation limitations that hinder transportation to clinic appointments.  .Services Needed at time of discharge: Y = Yes, Blank = No PT:   OT:   RN:   Equipment:   Other:     LOS: 1 day   Genelle Gather, MD 02/24/2014, 1:03 PM

## 2014-02-24 NOTE — Plan of Care (Signed)
Problem: Phase I Progression Outcomes Goal: Hemodynamically stable Outcome: Progressing No acute events this shift.  Patient has denied chest pain this shift.  Denies itching as well.  VSS.  No ectopy observed on tele.  Will continue to monitor patient condition.

## 2014-02-24 NOTE — Consult Note (Addendum)
CARDIOLOGY CONSULT NOTE   Patient ID: Shawn Casey MRN: [id number], DOB/AGE: August 16, 1955   Admit date: 02/23/2014 Date of Consult: 02/24/2014  Primary Physician: No PCP Per Patient Primary Cardiologist: None/Dr Delton See  Reason for consult:  Chest pain  Problem List  History reviewed. No pertinent past medical history.  History reviewed. No pertinent past surgical history.   Allergies  No Known Allergies  HPI   65 [place]ali man (moved to [place] 2011, doesn't speak english, son can translate) who presents with episode of exertional retrosternal CP yesterday  He has no prior medical history. His pain started while moving boxes at work and resolved at rest. The patient used to smoke but quit 15 years ago, he has no family h/o premature CAD. He is fit, doesn't exercise but works manually. He admits to mild SOB associated with his pain but no diaphoresis, radiation to the back, jaw, arm. No dizziness, or syncope. No LE edema, orthopnea or PND. Denies history of this pain in the past. He also complained of diffuse extremity rash for 1 day. Denies known allergies. Denies any difficulty with breathing, tongue swelling, lip swelling. No itching today.   Inpatient Medications  . aspirin EC  81 mg Oral Daily  . cetirizine HCl  5 mg Oral Daily  . heparin  5,000 Units Subcutaneous 3 times per day  . Influenza vac split quadrivalent PF  0.5 mL Intramuscular Tomorrow-1000  . ranitidine  150 mg Oral BID   Family History History reviewed. No pertinent family history.   Social History History   Social History  . Marital Status: Married    Spouse Name: N/A    Number of Children: N/A  . Years of Education: N/A   Occupational History  . Not on file.   Social History Main Topics  . Smoking status: Former Games developer  . Smokeless tobacco: Not on file  . Alcohol Use: No  . Drug Use: No  . Sexual Activity: Not Currently   Other Topics Concern  . Not on file   Social History  Narrative  . No narrative on file    Review of Systems  General:  No chills, fever, night sweats or weight changes.  Cardiovascular:  No chest pain, dyspnea on exertion, edema, orthopnea, palpitations, paroxysmal nocturnal dyspnea. Dermatological: No rash, lesions/masses Respiratory: No cough, dyspnea Urologic: No hematuria, dysuria Abdominal:   No nausea, vomiting, diarrhea, bright red blood per rectum, melena, or hematemesis Neurologic:  No visual changes, wkns, changes in mental status. All other systems reviewed and are otherwise negative except as noted above.  Physical Exam  Blood pressure 114/58, pulse 56, temperature 97.7 F (36.5 C), temperature source Oral, resp. rate 16, weight 123 lb 4.8 oz (55.929 kg), SpO2 100.00%.  General: Pleasant, NAD Psych: Normal affect. Neuro: Alert and oriented X 3. Moves all extremities spontaneously. HEENT: Normal  Neck: Supple without bruits or JVD. Lungs:  Resp regular and unlabored, CTA. Heart: RRR no s3, s4, or murmurs. Abdomen: Soft, non-tender, non-distended, BS + x 4.  Extremities: No clubbing, cyanosis or edema. DP/PT/Radials 2+ and equal bilaterally.  Labs  Recent Labs  02/23/14 1720 02/23/14 2248 02/24/14 0426  TROPONINI <0.30 <0.30 <0.30   Lab Results  Component Value Date   WBC 5.2 02/23/2014   HGB 13.9 02/23/2014   HCT 41.0 02/23/2014   MCV 80.8 02/23/2014   PLT 148* 02/23/2014    Recent Labs Lab 02/23/14 1337  NA 142  K 3.8  CL 109  BUN  10  CREATININE 1.10  GLUCOSE 88   Radiology/Studies  Dg Chest 2 View  02/23/2014   CLINICAL DATA:  Chest pain.  EXAM: CHEST  2 VIEW  COMPARISON:  05/16/2010  FINDINGS: The cardiomediastinal silhouette is within normal limits. The lungs are well inflated and clear. There is no evidence of pleural effusion or pneumothorax. No acute osseous abnormality is identified.  IMPRESSION: No active cardiopulmonary disease.     Echocardiogram - none  ECG - SR, normal ECG   ASSESSMENT  AND PLAN  58 year old male  1. Chest pain - typical exertional that resolves at rest, ECG normal , negative troponin, risk factors include male sex and h/o smoking. We would proceed with stress echocardiogram.  2. Extremity rash - now almost resolved, no suspicion for endocarditis, no symptoms or findings on clinical exam to support it.  3. BP - normal  4. Lipids - pending   Signed, Lars Masson, MD, [facility] 02/24/2014, 9:11 AM

## 2014-02-24 NOTE — Discharge Summary (Signed)
Name: Shawn Casey MRN: [id number] DOB: 1956-03-30 58 y.o. PCP: No Pcp Per Patient  Date of Admission: 02/23/2014 12:01 PM Date of Discharge: 02/24/2014 Attending Physician: Burns Spain, MD  Discharge Diagnosis:   Chest pain:   Urticaria  Discharge Medications:   Medication List         aspirin 81 MG EC tablet  Take 1 tablet (81 mg total) by mouth daily.        Disposition and follow-up:   Shawn Casey was discharged from [facility] in Stable condition.  At the hospital follow up visit please address:  1.  F/u compliance with daily ASA  2.  Labs / imaging needed at time of follow-up: None  3.  Pending labs/ test needing follow-up: None  Follow-up Appointments: Follow-up Information   Call [facility]    . (Please call [facility] at [phone] at 9AM on Monday September 21st to make an appointment for Wednesday September 23rd. Tell them that you are a new patient and need a clinic appointment for "hospital follow up")    Contact information:   [address] [place] [address] [phone]      Discharge Instructions: You were admitted to [facility] for evaluation of chest pain and probable allergic reaction.  Please call [facility] at [phone] at 9AM on Monday September 21st to make an appointment for Wednesday September 23rd. Tell them that you are a new patient and need a clinic appointment for "hospital follow up."  You started taking aspirin  taken by mouth once per day for prevention of a heart attack. The tests done in the hospital revealed that you most likely did not have a heart attack. However, it will be important to take this 81 mg Asprin to prevent a heart attack in the future.   You may resume your normal daily activity, but be mindful of any chest pain, shortness of breath, or  sweating associated with doing hard physical work and tell your doctor right away. Please be mindful of all foods and new materials you come into contact with, as we are suspicious that your itching was due to exposure to an allergen.   Discharge Instructions   Activity as tolerated - No restrictions    Complete by:  As directed      Call MD for:  extreme fatigue    Complete by:  As directed      Call MD for:  hives    Complete by:  As directed      Call MD for:  persistant dizziness or light-headedness    Complete by:  As directed      Call MD for:  persistant nausea and vomiting    Complete by:  As directed      Call MD for:  severe uncontrolled pain    Complete by:  As directed      Diet general    Complete by:  As directed      Discharge instructions    Complete by:  As directed   Be sure to call the [facility] on Monday to schedule a hospital follow up appointment.  We started you on Aspirin here in the hospital and are sending you out with Aspirin  by mouth daily.           Consultations: Treatment Team:  Rounding Lbcardiology, MD  Procedures Performed:  Dg Chest 2 View  02/23/2014   CLINICAL DATA:  Chest pain.  EXAM: CHEST  2 VIEW  COMPARISON:  05/16/2010  FINDINGS: The cardiomediastinal silhouette is within normal limits. The lungs are well inflated and clear. There is no evidence of pleural effusion or pneumothorax. No acute osseous abnormality is identified.  IMPRESSION: No active cardiopulmonary disease.   Electronically Signed   By: Sebastian Ache   On: 02/23/2014 13:45   2D Echo:  Study Conclusions - Left ventricle: The cavity size was normal. Systolic function was vigorous. The estimated ejection fraction was in the range of 65% to 70%. Wall motion was normal; there were no regional wall motion abnormalities. Doppler parameters are consistent with abnormal left ventricular relaxation (grade 1 diastolic dysfunction). There was no evidence of  elevated ventricular filling pressure by Doppler parameters. - Aortic valve: Trileaflet; normal thickness leaflets. There was no regurgitation. - Aortic root: The aortic root was normal in size. - Right atrium: The atrium was normal in size. - Tricuspid valve: There was mild regurgitation. - Pulmonic valve: There was no regurgitation. - Pulmonary arteries: Systolic pressure was within the normal range. - Pericardium, extracardiac: There was no pericardial effusion.  Impressions: - Normal biventricular size and systolic function. Abnormal relaxation with normal filling pressures. Mild tricuspid regurgitation. Normal RVSP.  Cardiac Cath: n/a  Admission HPI: Pt is a 58yo M w/ no PMH who presents to the ED from Urgent Care with hives and an episode of chest pain. His son is present and speaks fluent [place] and is able to translate for the patient who is from [place] and does not speak [place]. 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 He is unable to qualify the pain, but states that he sat down and the pain and dizziness resolved after 10-15 minutes. He denies any further pain since that time; however ED documentation states that the patient was having chest pain in the ED while ambulating to the bathroom.  Per the son, when the patient came home from work around [employer], he was in his normal state of health. However, when he awoke this morning he was itching all over with swelling in his BUE and BLE, denied hives, but did feel as though his throat was swelling. He was taken to Urgent Care by his family where he was noted to have hives but no edema or respiratory symptoms. He was given Benadryl, ASA, NTG and then transferred to [facility] in stable condition. Once in the ED, his itching had almost resolved. The patient and the family deny any  chemical exposures, changes in detergents or soaps, or changes in po consumption.   Admission Physical Exam:  Blood pressure 118/71, pulse 64, temperature 98.4 F (36.9 C), temperature source Oral, resp. rate 16, SpO2 96.00%.  General: Alert & oriented x 3, well-developed, and cooperative on examination.  Head: Normocephalic and atraumatic.  Eyes: Vision grossly intact, pupils equal, round, and reactive to light  Neck: Supple, full ROM, no JVD.  Lungs: CTAB, normal respiratory effort, no accessory muscle use, no crackles, and no wheezes. Heart: Regular rate, regular rhythm, no murmur, no gallop, and no rub.  Abdomen: Soft, non-tender, non-distended, normal bowel sounds, no guarding, no rebound tenderness, no organomegaly.  Msk: No joint swelling, warmth, or erythema.  Extremities: No peripheral edema Neurologic: Alert &  oriented X3, cranial nerves II-XII intact, strength normal in all extremities, sensation intact. Gait not observed Skin: 2 5mm raised, erythematous lesions on left lateral back with erythema extending across the lower back fields. 1cm raised erythematous area on right posterior arm superior to elbow.  Psych: Memory intact for recent and remote. Poor eye contact.   Hospital Course by problem list: Pt is a 57yo M w/ no PMH who presents to the ED from Urgent Care with hives and an episode of chest pain and urticaria   Chest pain: Pt reported 1 x episode of chest pain one day prior to admission that increased over first 15 min and was associated with dizziness and shortness of breath, all resolving by 30 min. EKG normal sinus rhythm without any ST changes. Troponin negative x3. Pain location seems to be more in midepigastrium and could be related to GERD, although the patient denies any GI symptoms. Pain may be MSK in etiology as patient has physically demanding job. He was seen by Cardiology and a stress ECHO was performed revealing normal EF, grade one diastolic dysfunction, and no  focal wall motion abnormalities. Lipid panel was normal. Given his age and gender, he was started on and discharged on daily ASA  for primary prevention.   Urticaria, possibly idiopathic: Pt awoke with itching across torso, back and extremtiies. Pt and family denied any hives, but did endorse swelling in bilateral upper and lower extremities. Pt also reported "fullness" in his throat but without difficulty breathing or wheezing. He presented to Urgent Care where hives were documented on exam and was given Benadryl  x1, ASA, and NTG (no report of chest pain at the Urgent Care). In the ED, patient with few scattered urticarial lesions on left back and R arm. Denies any current respiratory symptoms. Received Zyretc  and Zantac  BID with Solumedrol  x1 to help prevent biphasic reaction. At discharge, denied itching and had no urticaria or erythema on exam. At time of discharge, still unclear about the cause for the urticaria, but pt advised to monitor for symptoms and be mindful of new exposures.   Discharge Vitals:   BP 110/68  Pulse 80  Temp(Src) 98.2 F (36.8 C) (Oral)  Resp 16  Wt 123 lb 4.8 oz (55.929 kg)  SpO2 95%  Discharge Labs:  No results found for this or any previous visit (from the past 24 hour(s)).   Services Ordered on Discharge: n/a Equipment Ordered on Discharge: n/a

## 2014-02-24 NOTE — Progress Notes (Signed)
  Date: 02/24/2014  Patient name: Shawn Casey  Medical record number: [id number]  Date of birth: June 25, 1955   I have seen and evaluated [place] [facility] and discussed their care with the Residency Team. The pt is a 58 yo man from [place] who immigrated here in 2011. He does not speak [place]. He had no PMHx, other than taking TB prophylaxis after coming here, and take no meds. He was at work, packing boxes, when he had sudden onset substernal CP associated with dizziness, sweating, and shortness of breath. It did not radiate. He sat down and it got worse for 15 min and then resolved over 30 min. He has never had CP like this before.   He had negative Trop and EKG. Cards requested a stress ECHO which was nl.  He additionally had rash, hives, and itching. He was tx with Benadryl, H2B, and solumedrol and the sxs resolved.  Assessment and Plan: I have seen and evaluated the patient as outlined above. I agree with the formulated Assessment and Plan as detailed in the residents' admission note, with the following changes:   1. Non cardiac CP - Stable for D/C and outpt F/U.  Burns Spain, MD 9/18/20155:02 PM

## 2014-02-24 NOTE — H&P (Signed)
  Date: 02/24/2014               Patient Name:  Shawn Casey MRN: [id number]  DOB: [birth year] Age / Sex: 58 y.o., male   PCP: No Pcp Per Patient              Medical Service: Internal Medicine Teaching Service     I have reviewed the note by Juliane Lack, MS IV and was present during the interview and physical exam.  Please see my separate note from 02/23/14 for findings, assessment, and plan.  Signed: Genelle Gather, MD 02/24/2014, 12:59 PM

## 2014-02-24 NOTE — Progress Notes (Signed)
  Echocardiogram Echocardiogram Stress Test has been performed.  Shawn Casey 02/24/2014, 2:10 PM

## 2014-02-24 NOTE — Progress Notes (Signed)
  Echocardiogram 2D Echocardiogram has been performed.  Georgian Co 02/24/2014, 11:18 AM

## 2014-02-24 NOTE — Progress Notes (Signed)
   LOS: 1 day   Subjective: - No chest pain overnight - Breathing comfortably - No ectopy on tele  Objective: BP 114/58  Pulse 56  Temp(Src) 97.7 F (36.5 C) (Oral)  Resp 16  Wt 55.929 kg (123 lb 4.8 oz)  SpO2 100%  Intake/Output Summary (Last 24 hours) at 02/24/14 1029 Last data filed at 02/24/14 0542  Gross per 24 hour  Intake    480 ml  Output      0 ml  Net    480 ml    Physical Exam: GEN: NAD, lying in bed EYES: EOMI CV: brady (asymptomatic,) regular, no mrg PULM: fine crackles at left base ABD: soft, NT/ND, +BS EXT: No edema, resolved urticaria and erythema on back and arm  Labs/Studies: I have reviewed labs and studies from last 24hrs per EMR.  Medications: I have reviewed the patient's current medications.  Assessment/Plan: Principal Problem:   Chest pain Active Problems:   Urticaria  Shawn Casey is a 58 y.o. male with no reported PMH that p/w 1 day itching extremities associated with swelling and some redness as well as episode of chest pain.   Chest pain: Denies ongoing pain outside of acute episode yesterday. EKG and trops negative ACS. However given nature of pain (substernal, exertional, relieved with rest) will proceed with stress ECHO today per cardiology. May d/c to home with f/u at [facility]. - asa 81 daily - f/u ECHO   Urticaria of unknown origin: Resolved today. Pt comfortable and breathing with normal effort. Will continue combo anti H1/H2 for now. May d/c up discharge with instructions to monitor symptoms and report at outpt f/u.  - Zyretc  daily  - Zantac  BID  - s/p pulsed medrol    This is a Psychologist, occupational Note.  The care of the patient was discussed with Dr. Sherrine Maples and the assessment and plan formulated with their assistance.  Please see their attached note for official documentation of the daily encounter.

## 2014-02-24 NOTE — Discharge Instructions (Signed)
You were admitted to [facility] for evaluation of chest pain and probable allergic reaction.  Please call [facility] at [phone] at 9AM on Monday September 21st to make an appointment for Wednesday September 23rd. Tell them that you are a new patient and need a clinic appointment for "hospital follow up."  You started taking aspirin  taken by mouth once per day for prevention of a heart attack. The tests done in the hospital revealed that you most likely did not have a heart attack. However, it will be important to take this 81 mg Asprin to prevent a heart attack in the future.   You may resume your normal daily activity, but be mindful of any chest pain, shortness of breath, or sweating associated with doing hard physical work and tell your doctor right away. Please be mindful of all foods and new materials you come into contact with, as we are suspicious that your itching was due to exposure to an allergen.

## 2014-02-24 NOTE — Progress Notes (Signed)
Utilization Review Completed.Olie Scaffidi T9/18/2015  

## 2016-04-25 ENCOUNTER — Ambulatory Visit (HOSPITAL_COMMUNITY)
Admission: EM | Admit: 2016-04-25 | Discharge: 2016-04-25 | Disposition: A | Discharge status: Home / Self Care | Payer: BLUE CROSS/BLUE SHIELD | Attending: Family Medicine | Admitting: Family Medicine

## 2016-04-25 ENCOUNTER — Encounter (HOSPITAL_COMMUNITY): Payer: Self-pay | Admitting: *Deleted

## 2016-04-25 DIAGNOSIS — M5431 Sciatica, right side: Secondary | ICD-10-CM | POA: Diagnosis not present

## 2016-04-25 DIAGNOSIS — K0510 Chronic gingivitis, plaque induced: Secondary | ICD-10-CM

## 2016-04-25 MED ORDER — PREDNISONE 20 MG PO TABS: ORAL_TABLET | ORAL | 0 refills | Status: DC

## 2016-04-25 MED ORDER — PENICILLIN V POTASSIUM 500 MG PO TABS: 500.0000 mg | ORAL_TABLET | Freq: Three times a day (TID) | ORAL | 0 refills | Status: DC

## 2016-04-25 NOTE — ED Triage Notes (Signed)
Pt  Reports   Pain  r  Side         X  sev  Weeks     denys  Any  Injury        Pt  Wearing  A       Brace   Pain  Worse  On   Movement

## 2016-04-25 NOTE — Discharge Instructions (Signed)
Please return if you're not improving after 48 hours

## 2016-04-25 NOTE — ED Provider Notes (Signed)
[facility]    CSN: [id number] Arrival date & time: 04/25/16  1304     History   Chief Complaint Chief Complaint  Patient presents with  . Back Pain    HPI Shawn Casey is a 60 y.o. male.   This is a 60 year old man from [place] with 2 weeks of low back pain, primarily in the right lower back. He has no history of heavy lifting or fall. He's not had this before.  Patient states that his pain is constant when he sits down but is relieved by lying or standing. The pain radiates down his right leg about half way. He says that he has a little bit of numbness in the right leg but no weakness.  [place] He is accompanied by his son and daughter-in-law.      History reviewed. No pertinent past medical history.  Patient Active Problem List   Diagnosis Date Noted  . Chest pain 02/23/2014  . Urticaria 02/23/2014    History reviewed. No pertinent surgical history.     Home Medications    Prior to Admission medications   Medication Sig Start Date End Date Taking? Authorizing Provider  aspirin EC 81 MG EC tablet Take 1 tablet (81 mg total) by mouth daily. 02/24/14   Genelle GatherKathryn F Glenn, MD  predniSONE (DELTASONE) 20 MG tablet Two daily with food 04/25/16   Elvina SidleKurt Analiya Porco, MD    Family History History reviewed. No pertinent family history.  Social History Social History  Substance Use Topics  . Smoking status: Former Games developermoker  . Smokeless tobacco: Not on file  . Alcohol use No     Allergies   Patient has no known allergies.   Review of Systems Review of Systems  Constitutional: Negative.   HENT: Negative.   Gastrointestinal: Negative.   Genitourinary: Negative.   Musculoskeletal: Positive for back pain.  Neurological: Positive for numbness.     Physical Exam Triage Vital Signs ED Triage Vitals  Enc Vitals Group     BP 04/25/16 1324 130/87     Pulse Rate 04/25/16 1324 73    Resp 04/25/16 1324 16     Temp 04/25/16 1324 98.2 F (36.8 C)     Temp Source 04/25/16 1324 Oral     SpO2 04/25/16 1324 97 %     Weight --      Height --      Head Circumference --      Peak Flow --      Pain Score 04/25/16 1326 8     Pain Loc --      Pain Edu? --      Excl. in GC? --    No data found.   Updated Vital Signs BP 130/87 (BP Location: Left Arm)   Pulse 73   Temp 98.2 F (36.8 C) (Oral)   Resp 16   SpO2 97%      Physical Exam  Constitutional: He is oriented to person, place, and time. He appears well-developed and well-nourished.  HENT:  Head: Normocephalic.  Right Ear: External ear normal.  Left Ear: External ear normal.  Mouth/Throat: Oropharynx is clear and moist.  Eyes: Conjunctivae are normal. Pupils are equal, round, and reactive to light.  Neck: Normal range of motion.  Abdominal: There is no tenderness.  Musculoskeletal: Normal range of motion.  Straight-leg raising is positive on the right. Patient has good range of motion of his hip,  knee, and ankle. There is no loss of muscle mass in the right leg  Neurological: He is alert and oriented to person, place, and time.  Skin: Skin is warm and dry.  Nursing note and vitals reviewed.    UC Treatments / Results  Labs (all labs ordered are listed, but only abnormal results are displayed) Labs Reviewed - No data to display  EKG  EKG Interpretation None       Radiology No results found.  Procedures Procedures (including critical care time)  Medications Ordered in UC Medications - No data to display   Initial Impression / Assessment and Plan / UC Course  I have reviewed the triage vital signs and the nursing notes.  Pertinent labs & imaging results that were available during my care of the patient were reviewed by me and considered in my medical decision making (see chart for details).  Clinical Course     Final Clinical Impressions(s) / UC Diagnoses   Final diagnoses:    Sciatica of right side    New Prescriptions New Prescriptions   PREDNISONE (DELTASONE) 20 MG TABLET    Two daily with food     Elvina SidleKurt Dervin Vore, MD 04/25/16 1341

## 2016-11-24 ENCOUNTER — Ambulatory Visit (HOSPITAL_COMMUNITY)
Admission: EM | Admit: 2016-11-24 | Discharge: 2016-11-24 | Disposition: A | Discharge status: Home / Self Care | Payer: BLUE CROSS/BLUE SHIELD | Attending: Family Medicine | Admitting: Family Medicine

## 2016-11-24 ENCOUNTER — Encounter (HOSPITAL_COMMUNITY): Payer: Self-pay | Admitting: Emergency Medicine

## 2016-11-24 DIAGNOSIS — K047 Periapical abscess without sinus: Secondary | ICD-10-CM | POA: Diagnosis not present

## 2016-11-24 MED ORDER — CLINDAMYCIN HCL 300 MG PO CAPS: 300.0000 mg | ORAL_CAPSULE | Freq: Three times a day (TID) | ORAL | 0 refills | Status: DC

## 2016-11-24 MED ORDER — HYDROCODONE-ACETAMINOPHEN 5-325 MG PO TABS: 1.0000 | ORAL_TABLET | Freq: Four times a day (QID) | ORAL | 0 refills | Status: DC | PRN

## 2016-11-24 NOTE — ED Provider Notes (Signed)
CSN: [id number]     Arrival date & time 11/24/16  1738 History   First MD Initiated Contact with Patient 11/24/16 1859     Chief Complaint  Patient presents with  . Dental Pain   (Consider location/radiation/quality/duration/timing/severity/associated sxs/prior Treatment) Shawn SchaumannKrishna Bahadur Casey is a 61 y.o. male who [facility] urgent care with a chief complaint of dental pain for the past 3 days, along with fever, and chills.   The history is provided by the patient.  Dental Pain  Location:  Lower Lower teeth location:  18/LL 2nd molar Quality:  Pulsating, pressure-like and aching Severity:  Severe Onset quality:  Gradual Duration:  2 days Timing:  Constant Progression:  Worsening Chronicity:  New Context: abscess   Relieved by:  Nothing Worsened by:  Cold food/drink, hot food/drink and pressure Associated symptoms: facial pain and fever   Associated symptoms: no congestion, no difficulty swallowing, no drooling, no headaches, no neck pain, no neck swelling, no oral bleeding, no oral lesions and no trismus     History reviewed. No pertinent past medical history. History reviewed. No pertinent surgical history. History reviewed. No pertinent family history. Social History  Substance Use Topics  . Smoking status: Former Games developermoker  . Smokeless tobacco: Never Used  . Alcohol use No    Review of Systems  Constitutional: Positive for fever. Negative for appetite change and chills.  HENT: Positive for dental problem. Negative for congestion, drooling, mouth sores, rhinorrhea, sinus pain, sinus pressure and trouble swallowing.   Respiratory: Negative.   Cardiovascular: Negative.   Gastrointestinal: Negative.   Musculoskeletal: Negative for myalgias, neck pain and neck stiffness.  Neurological: Negative for light-headedness and headaches.    Allergies  Patient has no known allergies.  Home Medications   Prior to Admission medications   Medication Sig  Start Date End Date Taking? Authorizing Provider  aspirin EC 81 MG EC tablet Take 1 tablet (81 mg total) by mouth daily. 02/24/14   Genelle GatherGlenn, Kathryn F, MD  clindamycin (CLEOCIN) 300 MG capsule Take 1 capsule (300 mg total) by mouth 3 (three) times daily. 11/24/16   Dorena BodoKennard, Mailynn Everly, NP  HYDROcodone-acetaminophen (NORCO/VICODIN) 5-325 MG tablet Take 1 tablet by mouth every 6 (six) hours as needed. 11/24/16   Dorena BodoKennard, Mancel Lardizabal, NP  penicillin v potassium (VEETID) 500 MG tablet Take 1 tablet (500 mg total) by mouth 3 (three) times daily. 04/25/16   Elvina SidleLauenstein, Kurt, MD  predniSONE (DELTASONE) 20 MG tablet Two daily with food 04/25/16   Elvina SidleLauenstein, Kurt, MD   Meds Ordered and Administered this Visit  Medications - No data to display  BP 119/63 (BP Location: Right Arm)   Pulse 63   Temp 97.6 F (36.4 C) (Oral)   Resp 16   SpO2 100%  No data found.   Physical Exam  Constitutional: He is oriented to person, place, and time. He appears well-developed and well-nourished. No distress.  HENT:  Head: Normocephalic and atraumatic.  Right Ear: External ear normal.  Left Ear: External ear normal.  Mouth/Throat: Dental abscesses present.    Eyes: Conjunctivae are normal.  Neck: Normal range of motion.  Cardiovascular: Normal rate and regular rhythm.   Pulmonary/Chest: Effort normal and breath sounds normal.  Lymphadenopathy:    He has no cervical adenopathy.  Neurological: He is alert and oriented to person, place, and time.  Skin: Skin is warm and dry. Capillary refill takes less than 2 seconds. He is not diaphoretic.  Psychiatric: He has a normal  mood and affect. His behavior is normal.  Nursing note and vitals reviewed.   Urgent Care Course     Procedures (including critical care time)  Labs Review Labs Reviewed - No data to display  Imaging Review No results found.      MDM   1. Dental abscess    Dental abscess of the 18th tooth, recommend following up with dentistry for  further treatment and care, given prescription for clindamycin and hydrocodone.  [place] controlled substances reporting system consulted prior to issuing prescription. No narcotic prescriptions of been written in the last 6 months.    Dorena Bodo, NP 11/24/16 1910

## 2016-11-24 NOTE — ED Triage Notes (Signed)
Via son who speaks Nep[place]  Pt c/o dental pain on lower left side onset yest associated w/fevers  A&O x4... NAD... Ambulatory

## 2016-11-24 NOTE — Discharge Instructions (Signed)
You have infected dental abscess at the 18th tooth. This is causing your pain. Started on antibiotics. Take one tablet every 8 hours. I have also prescribed a medicine for pain called hydrocodone, this medicine is a narcotic, it will cause drowsiness, and it is addictive. Do not take more than what is necessary, do not drink alcohol while taking, and do not operate any heavy machinery while taking this medicine. He will need to see a dentist for further treatment otherwise the pain will recur.

## 2017-05-11 ENCOUNTER — Ambulatory Visit (INDEPENDENT_AMBULATORY_CARE_PROVIDER_SITE_OTHER): Payer: BLUE CROSS/BLUE SHIELD | Admitting: Family Medicine

## 2017-05-11 ENCOUNTER — Encounter: Payer: Self-pay | Admitting: Family Medicine

## 2017-05-11 ENCOUNTER — Ambulatory Visit (HOSPITAL_COMMUNITY)
Admission: RE | Admit: 2017-05-11 | Discharge: 2017-05-11 | Disposition: A | Discharge status: Home / Self Care | Payer: BLUE CROSS/BLUE SHIELD | Source: Ambulatory Visit | Attending: Family Medicine | Admitting: Family Medicine

## 2017-05-11 VITALS — BP 136/60 | HR 60 | Temp 97.6°F | Resp 100 | Ht 60.0 in | Wt 126.0 lb

## 2017-05-11 DIAGNOSIS — M5136 Other intervertebral disc degeneration, lumbar region: Secondary | ICD-10-CM | POA: Insufficient documentation

## 2017-05-11 DIAGNOSIS — G8929 Other chronic pain: Secondary | ICD-10-CM | POA: Diagnosis not present

## 2017-05-11 DIAGNOSIS — Z1329 Encounter for screening for other suspected endocrine disorder: Secondary | ICD-10-CM

## 2017-05-11 DIAGNOSIS — Z130 Encounter for screening for diseases of the blood and blood-forming organs and certain disorders involving the immune mechanism: Secondary | ICD-10-CM

## 2017-05-11 DIAGNOSIS — M5441 Lumbago with sciatica, right side: Secondary | ICD-10-CM

## 2017-05-11 DIAGNOSIS — Z131 Encounter for screening for diabetes mellitus: Secondary | ICD-10-CM | POA: Diagnosis not present

## 2017-05-11 DIAGNOSIS — Z125 Encounter for screening for malignant neoplasm of prostate: Secondary | ICD-10-CM | POA: Diagnosis not present

## 2017-05-11 DIAGNOSIS — R002 Palpitations: Secondary | ICD-10-CM

## 2017-05-11 DIAGNOSIS — K029 Dental caries, unspecified: Secondary | ICD-10-CM

## 2017-05-11 NOTE — Patient Instructions (Signed)
Schedule a dental appointment with: [facility]  [address], [place], [place] [address] [phone]

## 2017-05-11 NOTE — Progress Notes (Signed)
Shawn Casey, is a 61 y.o. male  ZOX:[id number]  WJX:[id number]  DOB - August 04, 1955  CC:  Chief Complaint  Patient presents with  . Establish Care  . Leg Pain    right  . Dental Pain  HPI: Shawn Casey is a 61 y.o. male is here today establish care, requests evaluation of right leg pain, and dental pain.  He is from [place], and is accompanied today by his daughter-in-law who is providing interpretation for today's visit.  Patient is non-English speaking although understands some [place].  Medical history is only significant for chronic low back pain with sciatica and chronic dental caries.  He has never had any previous surgeries or hospitalizations. He complains today of a 3 year history of right sided sciatica for which he has received treatment at urgent cares in the past with antiinflammatory medication which were mildly effective in relief of symptoms. He has had no recent or past injury to his back.  Denies equina caudae symptoms.  Shawn Casey has had a cardiac work-up in the past for an episode of chest pain with associated dizziness and shortness of breath. He denies chest pain and shortness of breath although endorse episodic palpitations with associated dizziness. Negative stress test in 2015 and ECHO which was significant for diastolic dysfunction. He has been negative of chest pain , shortness of breath, or dizziness.  He also complain of dental pain which has been ongoing for several months. No previous dental evaluation of treatment. Denies gum drainage or bleeding. Pain is exacerbated with eating.    No Known Allergies History reviewed. No pertinent past medical history. Current Outpatient Medications on File Prior to Visit  Medication Sig Dispense Refill  . aspirin EC 81 MG EC tablet Take 1 tablet (81 mg total) by mouth daily. 30 tablet 11  . clindamycin (CLEOCIN) 300 MG capsule Take 1 capsule (300 mg total) by mouth 3 (three) times daily. (Patient not taking: Reported on  05/11/2017) 21 capsule 0  . HYDROcodone-acetaminophen (NORCO/VICODIN) 5-325 MG tablet Take 1 tablet by mouth every 6 (six) hours as needed. (Patient not taking: Reported on 05/11/2017) 15 tablet 0  . penicillin v potassium (VEETID) 500 MG tablet Take 1 tablet (500 mg total) by mouth 3 (three) times daily. (Patient not taking: Reported on 05/11/2017) 30 tablet 0  . predniSONE (DELTASONE) 20 MG tablet Two daily with food (Patient not taking: Reported on 05/11/2017) 10 tablet 0   No current facility-administered medications on file prior to visit.    Family History  Family history unknown: Yes   Social History   Socioeconomic History  . Marital status: Married    Spouse name: Not on file  . Number of children: Not on file  . Years of education: Not on file  . Highest education level: Not on file  Social Needs  . Financial resource strain: Not on file  . Food insecurity - worry: Not on file  . Food insecurity - inability: Not on file  . Transportation needs - medical: Not on file  . Transportation needs - non-medical: Not on file  Occupational History  . Not on file  Tobacco Use  . Smoking status: Former Games developer  . Smokeless tobacco: Never Used  Substance and Sexual Activity  . Alcohol use: No  . Drug use: No  . Sexual activity: Not Currently  Other Topics Concern  . Not on file  Social History Narrative  . Not on file    Review of Systems: Constitutional: Negative for  fever, chills, diaphoresis, activity change, appetite change and fatigue. HENT: Negative for ear pain, nosebleeds, congestion, facial swelling, rhinorrhea, neck pain, neck stiffness and ear discharge.  Eyes: Negative for pain, discharge, redness, itching and visual disturbance. Respiratory: Negative for cough, choking, chest tightness, shortness of breath, wheezing and stridor.  Cardiovascular: Negative for chest pain, palpitations and leg swelling. Gastrointestinal: Negative for abdominal  distention. Genitourinary: Negative for dysuria, urgency, frequency, hematuria, flank pain, decreased urine volume, difficulty urinating and dyspareunia.  Musculoskeletal: Negative for back pain, joint swelling, arthralgia and gait problem. Neurological: Negative for dizziness, tremors, seizures, syncope, facial asymmetry, speech difficulty, weakness, light-headedness, numbness and headaches.  Hematological: Negative for adenopathy. Does not bruise/bleed easily. Psychiatric/Behavioral: Negative for hallucinations, behavioral problems, confusion, dysphoric mood, decreased concentration and agitation.    Objective:   Vitals:   05/11/17 0946  BP: 136/60  Pulse: 60  Resp: (!) 100  Temp: 97.6 F (36.4 C)    Physical Exam: Constitutional: Patient appears well-developed and well-nourished. No distress. HENT: Normocephalic, atraumatic, External right and left ear normal. Oropharynx is clear and moist.  Eyes: Conjunctivae and EOM are normal. PERRLA, no scleral icterus. Neck: Normal ROM. Neck supple. No JVD. No tracheal deviation. No thyromegaly. CVS: RRR, S1/S2 +, no murmurs, no gallops, no carotid bruit.  Pulmonary: Effort and breath sounds normal, no stridor, rhonchi, wheezes, rales.  Abdominal: Soft. BS +, no distension, tenderness, rebound or guarding.  Musculoskeletal: Normal range of motion. No edema and no tenderness.  Lymphadenopathy: No lymphadenopathy noted, cervical, inguinal or axillary Neuro: Alert. Normal reflexes, muscle tone coordination. No cranial nerve deficit. Skin: Skin is warm and dry. No rash noted. Not diaphoretic. No erythema. No pallor. Psychiatric: Normal mood and affect. Behavior, judgment, thought content normal.  Lab Results  Component Value Date   WBC 5.2 02/23/2014   HGB 13.9 02/23/2014   HCT 41.0 02/23/2014   MCV 80.8 02/23/2014   PLT 148 (L) 02/23/2014   Lab Results  Component Value Date   CREATININE 1.10 02/23/2014   BUN 10 02/23/2014   NA 142  02/23/2014   K 3.8 02/23/2014   CL 109 02/23/2014    No results found for: HGBA1C Lipid Panel     Component Value Date/Time   CHOL 158 02/24/2014 0900   TRIG 138 02/24/2014 0900   HDL 28 (L) 02/24/2014 0900   CHOLHDL 5.6 02/24/2014 0900   VLDL 28 02/24/2014 0900   LDLCALC 102 (H) 02/24/2014 0900        Assessment and plan:  1. Chronic right-sided low back pain with right-sided sciatica, on-going chronic. No prior imaging on file. Obtaining lumbar spine and right hip imaging.  2. Screening for deficiency anemia, Checking CBC w/differential 3. Screening for diabetes mellitus, checking hemoglobin A1C and CMP 4. Screening for thyroid disorder, check Thyroid panel  5. Palpitation, last ECHO 2015 indicated diastolic dysfunction. , EKG, Negative of ischemia and no overt changes from prior imaging. Will refer to cardiology for second opinion.  6. Screening PSA (prostate specific antigen), check PSA level  7. Dental Caries- provided information to schedule a visit with local dentist.   Orders Placed This Encounter  Procedures  . DG Lumbar Spine Complete  . DG HIP UNILAT W OR W/O PELVIS 2-3 VIEWS RIGHT  . CBC with Differential  . Comprehensive metabolic panel  . TSH  . Hemoglobin A1c  . PSA  . Ambulatory referral to Cardiology  . EKG 12-Lead   -Daughter has concern with father's memory and insomnia and  will schedule a follow-up in 2 weeks to discuss.  Return in about 2 weeks (around 05/25/2017), or Memory and Insomnia.  The patient was given clear instructions to go to ER or return to medical center if symptoms don't improve, worsen or new problems develop. The patient verbalized understanding. The patient was told to call to get lab results if they haven't heard anything in the next week.    This note has been created with Education officer, environmentalDragon speech recognition software and smart phrase technology. Any transcriptional errors are unintentional.   Godfrey PickKimberly S. Tiburcio PeaHarris, MSN, FNP-C [facility]  [address] [place], [place] [address] [phone]

## 2017-05-12 ENCOUNTER — Telehealth: Payer: Self-pay | Admitting: Family Medicine

## 2017-05-12 DIAGNOSIS — M5136 Other intervertebral disc degeneration, lumbar region: Secondary | ICD-10-CM

## 2017-05-12 DIAGNOSIS — R7989 Other specified abnormal findings of blood chemistry: Secondary | ICD-10-CM

## 2017-05-12 LAB — COMPREHENSIVE METABOLIC PANEL
ALT: 35 IU/L (ref 0–44)
AST: 34 IU/L (ref 0–40)
Albumin/Globulin Ratio: 1.1 — ABNORMAL LOW (ref 1.2–2.2)
Albumin: 4.1 g/dL (ref 3.6–4.8)
Alkaline Phosphatase: 120 IU/L — ABNORMAL HIGH (ref 39–117)
BUN/Creatinine Ratio: 15 (ref 10–24)
BUN: 17 mg/dL (ref 8–27)
Bilirubin Total: 0.4 mg/dL (ref 0.0–1.2)
CO2: 24 mmol/L (ref 20–29)
Calcium: 9.4 mg/dL (ref 8.6–10.2)
Chloride: 105 mmol/L (ref 96–106)
Creatinine, Ser: 1.11 mg/dL (ref 0.76–1.27)
GFR calc Af Amer: 81 mL/min/{1.73_m2} (ref >59)
GFR calc non Af Amer: 70 mL/min/{1.73_m2} (ref >59)
GLOBULIN, TOTAL: 3.6 g/dL (ref 1.5–4.5)
Glucose: 84 mg/dL (ref 65–99)
Potassium: 4.3 mmol/L (ref 3.5–5.2)
Sodium: 144 mmol/L (ref 134–144)
Total Protein: 7.7 g/dL (ref 6.0–8.5)

## 2017-05-12 LAB — CBC WITH DIFFERENTIAL/PLATELET
Basophils Absolute: 0 10*3/uL (ref 0.0–0.2)
Basos: 0 %
EOS (ABSOLUTE): 0.1 10*3/uL (ref 0.0–0.4)
Eos: 2 %
HEMATOCRIT: 41.7 % (ref 37.5–51.0)
Hemoglobin: 14.1 g/dL (ref 13.0–17.7)
IMMATURE GRANULOCYTES: 0 %
Immature Grans (Abs): 0 10*3/uL (ref 0.0–0.1)
LYMPHS ABS: 1.3 10*3/uL (ref 0.7–3.1)
Lymphs: 27 %
MCH: 26.8 pg (ref 26.6–33.0)
MCHC: 33.8 g/dL (ref 31.5–35.7)
MCV: 79 fL (ref 79–97)
MONOS ABS: 0.4 10*3/uL (ref 0.1–0.9)
Monocytes: 8 %
NEUTROS PCT: 63 %
Neutrophils Absolute: 3.1 10*3/uL (ref 1.4–7.0)
PLATELETS: 142 10*3/uL — AB (ref 150–379)
RBC: 5.26 x10E6/uL (ref 4.14–5.80)
RDW: 14 % (ref 12.3–15.4)
WBC: 4.9 10*3/uL (ref 3.4–10.8)

## 2017-05-12 LAB — HEMOGLOBIN A1C
Est. average glucose Bld gHb Est-mCnc: 108 mg/dL
HEMOGLOBIN A1C: 5.4 % (ref 4.8–5.6)

## 2017-05-12 LAB — PSA: Prostate Specific Ag, Serum: 1 ng/mL (ref 0.0–4.0)

## 2017-05-12 LAB — TSH: TSH: 6.2 u[IU]/mL — AB (ref 0.450–4.500)

## 2017-05-12 MED ORDER — MELOXICAM 15 MG PO TABS: 15.0000 mg | ORAL_TABLET | Freq: Every day | ORAL | 0 refills | Status: DC

## 2017-05-12 NOTE — Telephone Encounter (Signed)
Contact patient as follows :Thyroid stimulating hormone is elevated which indicates possible hypothyroidism.  Patient needs to come back in the office so we can repeat a TSH panel.  He had a negative x-ray of his hip.  However the x-ray of his lumbar spine shows some degenerative disc disease within his back.  I recommend physical therapy in addition to taking meloxicam 50 mg once daily as needed for back pain.   Godfrey PickKimberly S. Tiburcio PeaHarris, MSN, FNP-C [facility]  [address] [place], [place] [address] [phone]

## 2017-05-13 NOTE — Telephone Encounter (Signed)
Patient notified

## 2017-05-27 ENCOUNTER — Encounter: Payer: Self-pay | Admitting: Family Medicine

## 2017-05-27 ENCOUNTER — Ambulatory Visit (INDEPENDENT_AMBULATORY_CARE_PROVIDER_SITE_OTHER): Payer: BLUE CROSS/BLUE SHIELD | Admitting: Family Medicine

## 2017-05-27 VITALS — BP 140/82 | HR 66 | Temp 98.3°F | Resp 14 | Ht 60.0 in | Wt 128.6 lb

## 2017-05-27 DIAGNOSIS — H547 Unspecified visual loss: Secondary | ICD-10-CM | POA: Diagnosis not present

## 2017-05-27 DIAGNOSIS — Z7409 Other reduced mobility: Secondary | ICD-10-CM

## 2017-05-27 DIAGNOSIS — M5441 Lumbago with sciatica, right side: Secondary | ICD-10-CM | POA: Diagnosis not present

## 2017-05-27 DIAGNOSIS — R7989 Other specified abnormal findings of blood chemistry: Secondary | ICD-10-CM

## 2017-05-27 DIAGNOSIS — R4189 Other symptoms and signs involving cognitive functions and awareness: Secondary | ICD-10-CM

## 2017-05-27 DIAGNOSIS — G8929 Other chronic pain: Secondary | ICD-10-CM

## 2017-05-27 MED ORDER — GABAPENTIN 300 MG PO CAPS: 300.0000 mg | ORAL_CAPSULE | Freq: Every day | ORAL | 1 refills | Status: DC

## 2017-05-27 NOTE — Progress Notes (Addendum)
Patient ID: Clelia SchaumannKrishna Bahadur Colon, male    DOB: [birth year], 61 y.o.   MRN: [id number]  PCP: Bing NeighborsHarris, Ashlyn Cabler S, FNP  Chief Complaint  Patient presents with  . Memory Loss  . Insomnia   Subjective:  HPI Justice BritainLata #[id number] interpreter services  Alver FisherKrishna Bahadur Veva HolesMainali is a 61 y.o. male presents for evaluation of memory loss and insomnia. Presents today with her granddaughter, who  is English-speaking.  Interpreter sleep service used in order to communicate medical information concisely. Attempted to perform Mini-Mental status exam with family member and attempted with interpreter due to language barrier makes with inability patient was unable to complete assessment tool.  Patient is well as granddaughter reports at minimum of 3-4-year presence of persistent memory loss.  Prior to this time patient's granddaughter reports that he was mentally intact.  Patient is able today to answer some questions with the assistance of the interpreter.  Although he notes that he is unable to do  any self-care independently.  Patient resides with family members who assist him daily with ADLs such as bathing, getting dressed, preparing meals, and sometimes assisting him with feeding.  He has no known history of a CVA or TBI.  [place] has been in [place] the last [place] years.  When asked if he is able to read American and his negative language he confirms that he is not.  Granddaughter reports that he is unable to leave the house unassisted.  He does not work and sleeps most of the day.  He is constantly awake during the nighttime hours.  He has no history of wandering, falling, or injury sustained due to impaired cognitive state.  Granddaughter is requesting medication for insomnia.  She reports that patient is up all hours of the night and only sleeps during the daytime.  He only leaves the house for medical appointments otherwise he remains sedentary on the couch for hours and hours at a time watching TV.  He has  no history of insomnia and has never been treated with medications. Attempted to complete the [place] sleepiness study, unable to complete. Family reports occasionally patient gait seems unstable therefore he sits most of the time to prevent falls. Requesting a cane today to assist with ambulation. He continues to experience pain from chronic back pain. He was recently referred to PT and intends to proceed with therapy. During his last office visit he was found to have an elevated TSH level. No prior history hypothyroidism although has developed recent palpation and fatigue.   Social History   Socioeconomic History  . Marital status: Married    Spouse name: Not on file  . Number of children: Not on file  . Years of education: Not on file  . Highest education level: Not on file  Social Needs  . Financial resource strain: Not on file  . Food insecurity - worry: Not on file  . Food insecurity - inability: Not on file  . Transportation needs - medical: Not on file  . Transportation needs - non-medical: Not on file  Occupational History  . Not on file  Tobacco Use  . Smoking status: Former Games developermoker  . Smokeless tobacco: Never Used  Substance and Sexual Activity  . Alcohol use: No  . Drug use: No  . Sexual activity: Not Currently  Other Topics Concern  . Not on file  Social History Narrative  . Not on file    Family History  Family history unknown: Yes   Review  of Systems  Constitutional: Positive for fatigue.  HENT: Negative.   Eyes: Positive for redness and visual disturbance. Negative for pain and itching.       Corrective lenses-unable to see anything through lenses   Respiratory: Negative.   Cardiovascular: Negative.   Genitourinary: Negative.   Musculoskeletal: Negative.   Skin: Negative.   Neurological: Negative for dizziness, seizures, syncope, facial asymmetry and weakness.       Impaired short-term and long-term memory Unable to self dress, self feed, or bathe   Hematological: Negative.   Psychiatric/Behavioral: Positive for sleep disturbance.   Patient Active Problem List   Diagnosis Date Noted  . Chest pain 02/23/2014  . 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 Authorizing Provider  aspirin EC 81 MG EC tablet Take 1 tablet (81 mg total) by mouth daily. 02/24/14   Genelle Gather, MD    Past Medical, Surgical Family and Social History reviewed and updated.    Objective:   Today's Vitals   05/27/17 1021  BP: 140/82  Pulse: 66  Resp: 14  Temp: 98.3 F (36.8 C)  TempSrc: Oral  SpO2: 100%  Weight: 128 lb 9.6 oz (58.3 kg)  Height: 5' (1.524 m)    Wt Readings from Last 3 Encounters:  05/27/17 128 lb 9.6 oz (58.3 kg)  05/11/17 126 lb (57.2 kg)  02/24/14 123 lb 4.8 oz (55.9 kg)    Physical Exam  Constitutional: He appears well-developed.  Thin appearance   HENT:  Head: Normocephalic and atraumatic.  Mouth/Throat: Oropharynx is clear and moist.  Eyes: Pupils are equal, round, and reactive to light.  Scleral redness present   Neck: Normal range of motion. Neck supple.  Cardiovascular: Normal rate, regular rhythm, normal heart sounds and intact distal pulses.  Pulmonary/Chest: Effort normal and breath sounds normal.  Abdominal: Soft. Bowel sounds are normal.  Musculoskeletal: He exhibits tenderness. He exhibits no edema.       Lumbar back: He exhibits tenderness. He exhibits no bony tenderness and no edema.  Lymphadenopathy:    He has no cervical adenopathy.  Neurological: He is alert. No sensory deficit. He displays no seizure activity. Gait abnormal.  Antalgic gait- ambulates independently Oriented to self, location, unable to recall day, date, or year (uncertain if this is a cognitive issue or intellectual limitation)   Skin: Skin is warm and dry.  Psychiatric: He has a normal mood and affect. His behavior is normal. Judgment and thought  content normal. Cognition and memory are impaired.  Flat Affect    Assessment & Plan:  1. 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 Due to language barrier and limited information provided by family member present. Unable to completely ascertain to what extent cognitive impairment is new and or simply limited due to lack of education or lack of health literacy. Patient confirms that he is unable and forgets how to dress and feed himself. During today's visit, patient was able to follow simple instructions such as : stand up, walk and touch the door, then back to the exam table, squeeze fingers with both hands, stretch out arms., etc. Gait is abnormal, although, he suffers from chronic back pain. Will refer to neurology for formal evaluation as to the degree and cause of impaired cognition.    2. Diminished vision, new per patient and family, no prior eye exam. Eyes are red on exam.  Will refer emergently to  [facility].  3. Impaired mobility-physical therapy referral recently placed. Will prescribed 3 prong cane to assist with balance and facility physical activity.   For home use only DME Cane  4. Elevated TSH, repeated Thyroid Panel With TSH today, if remains elevated will initiate levothyroxine therapy.    5. Chronic right sided low back pain with right sided sciatica, discontinuing Meloxicam. Will trial Gabapentin 300 mg at bedtime to improve pain and facilitate rest.   6. Insomnia, no medication indicated at present for insomnia. Suspect insomnia is resulting from immobility and sleeping during the day. Recommend daily stimulation with outings, senior community activities, and ambulation throughout he day. Limit nap to once no more than 15-30 minutes during the day.    Meds ordered this encounter  Medications  . gabapentin (NEURONTIN) 300 MG capsule    Sig: Take 1 capsule (300 mg total) by mouth at bedtime.    Dispense:  90 capsule    Refill:  1    Order  Specific Question:   Supervising Provider    Answer:   Quentin AngstJEGEDE, OLUGBEMIGA E [id number]]    Orders Placed This Encounter  Procedures  . For home use only DME Cane  . Ambulatory referral to Neurology  . Ambulatory referral to Ophthalmology    A total of 30 minutes spent, greater than 50 % of this time was spent reviewing prior medical history, use of interpreter service due to language barrier, time attempting to complete screening tools. discussing current plan of treatment, health promotion, and goals of treatment/ indications for speciality evaluation.  Godfrey PickKimberly S. Tiburcio PeaHarris, MSN, FNP-C [facility]  [address] [place], [place] [address] [phone]

## 2017-05-27 NOTE — Patient Instructions (Addendum)
I am placing a referral to neurology and ophthalmology for further evaluation of diminished cognition and diminished vision.  If you have not heard from neurology or ophthalmology within 2 weeks please phone the office to inquire about your referrals.  For sciatic nerve pain and to help with insomnia I am prescribing gabapentin 300 mg at night.  Medication may cause dizziness.    Increase physical activity during the day to improve quality of sleep at night.  I will follow-up with you regarding your TSH results.

## 2017-05-28 LAB — THYROID PANEL WITH TSH
Free Thyroxine Index: 1.3 (ref 1.2–4.9)
T3 UPTAKE RATIO: 27 % (ref 24–39)
T4 TOTAL: 4.9 ug/dL (ref 4.5–12.0)
TSH: 8.46 u[IU]/mL — ABNORMAL HIGH (ref 0.450–4.500)

## 2017-05-29 ENCOUNTER — Telehealth: Payer: Self-pay | Admitting: Family Medicine

## 2017-05-29 MED ORDER — LEVOTHYROXINE SODIUM 25 MCG PO TABS: 25.0000 ug | ORAL_TABLET | Freq: Every day | ORAL | 1 refills | Status: DC

## 2017-05-29 NOTE — Telephone Encounter (Signed)
Please contact patient granddaughter to advise I am starting patient on levothyroxine 25 mcg daily.  He needs to take his levothyroxine 30 minutes every morning prior to eating any food.  He will need to return to office in 6 weeks for repeat TSH level.

## 2017-05-29 NOTE — Telephone Encounter (Signed)
Patient has been notified and his grandson will pickup the medication

## 2017-06-02 ENCOUNTER — Telehealth: Payer: Self-pay | Admitting: Family Medicine

## 2017-06-02 NOTE — Telephone Encounter (Signed)
Please process ophthalmology referral to [facility]

## 2017-06-03 NOTE — Telephone Encounter (Signed)
Referral faxed

## 2017-06-15 ENCOUNTER — Other Ambulatory Visit: Payer: BLUE CROSS/BLUE SHIELD

## 2017-06-15 DIAGNOSIS — E039 Hypothyroidism, unspecified: Secondary | ICD-10-CM

## 2017-06-15 DIAGNOSIS — Z1329 Encounter for screening for other suspected endocrine disorder: Secondary | ICD-10-CM

## 2017-06-16 ENCOUNTER — Telehealth: Payer: Self-pay | Admitting: Family Medicine

## 2017-06-16 LAB — THYROID PANEL WITH TSH
Free Thyroxine Index: 1.4 (ref 1.2–4.9)
T3 UPTAKE RATIO: 27 % (ref 24–39)
T4 TOTAL: 5 ug/dL (ref 4.5–12.0)
TSH: 5.64 u[IU]/mL — AB (ref 0.450–4.500)

## 2017-06-16 NOTE — Telephone Encounter (Signed)
Lyla SonCarrie,  Please contact patient to advise he was to return in 6 weeks for repeat TSH. I see that he came into office on yesterday to have TSH completed. This value is inaccurate as he has not been on medication for a complete 6 weeks. Please have him scheduled to return for TSH level second or third week of February.

## 2017-06-16 NOTE — Telephone Encounter (Signed)
Left a vm for patient to callback 

## 2017-06-16 NOTE — Progress Notes (Signed)
erronous

## 2017-06-18 NOTE — Telephone Encounter (Signed)
Left a vm for patient to callback 

## 2017-06-19 NOTE — Telephone Encounter (Signed)
Left vm for patient to call back.

## 2017-06-22 ENCOUNTER — Encounter: Payer: Self-pay | Admitting: Cardiology

## 2017-06-22 ENCOUNTER — Ambulatory Visit (INDEPENDENT_AMBULATORY_CARE_PROVIDER_SITE_OTHER): Payer: BLUE CROSS/BLUE SHIELD | Admitting: Cardiology

## 2017-06-22 VITALS — BP 160/93 | HR 73 | Ht 60.0 in | Wt 128.2 lb

## 2017-06-22 DIAGNOSIS — R030 Elevated blood-pressure reading, without diagnosis of hypertension: Secondary | ICD-10-CM | POA: Diagnosis not present

## 2017-06-22 DIAGNOSIS — R079 Chest pain, unspecified: Secondary | ICD-10-CM

## 2017-06-22 DIAGNOSIS — R413 Other amnesia: Secondary | ICD-10-CM | POA: Insufficient documentation

## 2017-06-22 DIAGNOSIS — R002 Palpitations: Secondary | ICD-10-CM

## 2017-06-22 DIAGNOSIS — E039 Hypothyroidism, unspecified: Secondary | ICD-10-CM | POA: Insufficient documentation

## 2017-06-22 DIAGNOSIS — R0602 Shortness of breath: Secondary | ICD-10-CM

## 2017-06-22 NOTE — Telephone Encounter (Signed)
Letter sent for patient to contact office

## 2017-06-22 NOTE — Progress Notes (Signed)
Cardiology Office Note    Date:  06/22/2017   ID:  Shawn Casey [place], [place] Oct 21, 1955, MRN [id number]  PCP:  Bing Neighbors, FNP  Cardiologist:  Armanda Magic, MD   Chief Complaint  Patient presents with  . New Patient (Initial Visit)    palpitations    History of Present Illness:  Shawn Casey is a 62 y.o. male who is being seen today for the evaluation of palpitations at the request of Bing Neighbors, FNP.  This is a 62yo male with a history of memory loss, insomnia who saw his PCP last month and complained of palpitations as well as fatigue. He says that the palpitations occur every 1-2 months and are associated with SOB.  It has been going on for several years.  He also has been having some problems with chest pain that is exertional and nonexertional.  It occurs when walking and when bending over. The pain radiates throughout is body and even into his feet.  He says that he will break out in a sweat and get dizzy when he gets the chest discomfort.  He denies any LE edema or syncope.    Past Medical History:  Diagnosis Date  . Hypothyroidism   . Memory loss     History reviewed. No pertinent surgical history.  Current Medications: No outpatient medications have been marked as taking for the 06/22/17 encounter (Office Visit) with Quintella Reichert, MD.    Allergies:   Patient has no known allergies.   Social History   Socioeconomic History  . Marital status: Married    Spouse name: None  . Number of children: None  . Years of education: None  . Highest education level: None  Social Needs  . Financial resource strain: None  . Food insecurity - worry: None  . Food insecurity - inability: None  . Transportation needs - medical: None  . Transportation needs - non-medical: None  Occupational History  . None  Tobacco Use  . Smoking status: Former Games developer  . Smokeless tobacco: Never Used  Substance and Sexual Activity  . Alcohol use: No  . Drug  use: No  . Sexual activity: Not Currently  Other Topics Concern  . None  Social History Narrative  . None     Family History:  The family history was reviewed and he has no family history of heart disease.  His parents died of old age.  ROS:   Please see the history of present illness.    Review of Systems  Musculoskeletal: Positive for back pain.  Neurological: Positive for headaches and loss of balance.   All other systems reviewed and are negative.  No flowsheet data found.     PHYSICAL EXAM:   VS:  BP (!) 160/93   Pulse 73   Ht 5' (1.524 m)   Wt 128 lb 3.2 oz (58.2 kg)   BMI 25.04 kg/m    GEN: Well nourished, well developed, in no acute distress  HEENT: normal  Neck: no JVD, carotid bruits, or masses Cardiac: RRR; no murmurs, rubs, or gallops,no edema.  Intact distal pulses bilaterally.  Respiratory:  clear to auscultation bilaterally, normal work of breathing GI: soft, nontender, nondistended, + BS MS: no deformity or atrophy  Skin: warm and dry, no rash Neuro:  Alert and Oriented x 3, Strength and sensation are intact Psych: euthymic mood, full affect  Wt Readings from Last 3 Encounters:  06/22/17 128 lb 3.2 oz (58.2 kg)  05/27/17 128 lb 9.6 oz (58.3 kg)  05/11/17 126 lb (57.2 kg)      Studies/Labs Reviewed:   EKG:  EKG is not ordered today.    Recent Labs: 05/11/2017: ALT 35; BUN 17; Creatinine, Ser 1.11; Hemoglobin 14.1; Platelets 142; Potassium 4.3; Sodium 144 06/15/2017: TSH 5.640   Lipid Panel    Component Value Date/Time   CHOL 158 02/24/2014 0900   TRIG 138 02/24/2014 0900   HDL 28 (L) 02/24/2014 0900   CHOLHDL 5.6 02/24/2014 0900   VLDL 28 02/24/2014 0900   LDLCALC 102 (H) 02/24/2014 0900    Additional studies/ records that were reviewed today include:  Office notes from PCP    ASSESSMENT:    1. Palpitations   2. Chest pain, unspecified type   3. SOB (shortness of breath)   4. Elevated BP without diagnosis of hypertension       PLAN:  In order of problems listed above  1.  Palpitations -he is somewhat vague in his description of the palpitations.  These seem to have been occurring for several years.  His EKG is normal.  He does get symptomatic with them with shortness of breath.  I will get an event monitor to assess for arrhythmias.    2.  Chest pain -again he is extremely vague in his description of the chest pain and also due to the language barrier it is difficult to get a good description of his chest pain syndrome.  It appears that the pain occurs both with exertion and not exertion but he says that the pain radiates throughout his body even to the bottom of his feet.  It is associated with shortness of breath and diaphoresis.  He has no family history of coronary disease.  He has a very remote history of tobacco use when he was young.  His EKG is nonischemic.  I will set him up for a Lexiscan Myoview to rule out inducible ischemia.  3.  SOB -shortness of breath seems to come on when he gets the palpitations as well as when he exerts himself and associated with chest pain.  I will get a 2D echocardiogram to assess LV function and also a nuclear stress test as stated above.  He has no signs of volume overload on exam  4.  Elevated BP -blood pressure is elevated on exam today without any history of hypertension.  BP was normal on exam with NP last month.  Will continue to follow.  Ultimately may need to be on antihypertensive therapy.     Medication Adjustments/Labs and Tests Ordered: Current medicines are reviewed at length with the patient today.  Concerns regarding medicines are outlined above.  Medication changes, Labs and Tests ordered today are listed in the Patient Instructions below.  There are no Patient Instructions on file for this visit.   Signed, Armanda Magicraci Camara Rosander, MD  06/22/2017 10:20 AM    [facility] [address], [place], [place]  [address] Phone: [phone]; Fax:  [phone]

## 2017-06-22 NOTE — Patient Instructions (Signed)
Medication Instructions:  Your physician recommends that you continue on your current medications as directed. Please refer to the Current Medication list given to you today.  If you need a refill on your cardiac medications, please contact your pharmacy first.  Labwork: None ordered   Testing/Procedures: Your physician has requested that you have an echocardiogram. Echocardiography is a painless test that uses sound waves to create images of your heart. It provides your doctor with information about the size and shape of your heart and how well your heart's chambers and valves are working. This procedure takes approximately one hour. There are no restrictions for this procedure.  Your physician has requested that you have a lexiscan myoview. For further information please visit [email or web address]. Please follow instruction sheet, as given.  Your physician has recommended that you wear an event monitor. Event monitors are medical devices that record the heart's electrical activity. Doctors most often us these monitors to diagnose arrhythmias. Arrhythmias are problems with the speed or rhythm of the heartbeat. The monitor is a small, portable device. You can wear one while you do your normal daily activities. This is usually used to diagnose what is causing palpitations/syncope (passing out).    Follow-Up: Your physician wants you to follow-up as needed with Dr. Mayford Knifeurner.   Any Other Special Instructions Will Be Listed Below (If Applicable).     If you need a refill on your cardiac medications before your next appointment, please call your pharmacy.

## 2017-06-30 ENCOUNTER — Telehealth (HOSPITAL_COMMUNITY): Payer: Self-pay | Admitting: *Deleted

## 2017-06-30 NOTE — Telephone Encounter (Signed)
Interpreter line used for instructions Myna HidalgoKarma [phone]. No answer.Left message on voicemail in reference to upcoming appointment scheduled for 07/02/17. Phone number given for a call back so details instructions can be given. Shanaya Schneck, Adelene IdlerCynthia W

## 2017-06-30 NOTE — Telephone Encounter (Signed)
Patient's son Shawn Casey per DPR given detailed instructions per Myocardial Perfusion Study Information Sheet for the test on 07/02/17 at 0715. Patient notified to arrive 15 minutes early and that it is imperative to arrive on time for appointment to keep from having the test rescheduled.  If you need to cancel or reschedule your appointment, please call the office within 24 hours of your appointment. . Patient verbalized understanding.Philisha Weinel, Adelene IdlerCynthia W

## 2017-06-30 NOTE — Telephone Encounter (Signed)
Interpreter services notified that they can not provide an Nepali interpreter for 07/02/17. Patients son called and stated he could interpret for his father. Language line called and patient hung up on language line so not able to speak with patient to verify son could interpret. Son called and told him that he needed to speak with his father. He needed  to tell him to answer the phone tomorrow and give permission to us to have son to interpret.

## 2017-07-01 ENCOUNTER — Telehealth (HOSPITAL_COMMUNITY): Payer: Self-pay | Admitting: Radiology

## 2017-07-01 NOTE — Telephone Encounter (Signed)
Spoke with Shawn Casey  DPR son at length. He will serve as interpreter since live one not available . He will insure pt is  Properly prepped and the time he will be here for multiple appointments.

## 2017-07-01 NOTE — Telephone Encounter (Signed)
error 

## 2017-07-02 ENCOUNTER — Other Ambulatory Visit: Payer: Self-pay

## 2017-07-02 ENCOUNTER — Ambulatory Visit (HOSPITAL_COMMUNITY): Payer: BLUE CROSS/BLUE SHIELD | Attending: Cardiovascular Disease

## 2017-07-02 ENCOUNTER — Ambulatory Visit (HOSPITAL_BASED_OUTPATIENT_CLINIC_OR_DEPARTMENT_OTHER): Payer: BLUE CROSS/BLUE SHIELD

## 2017-07-02 ENCOUNTER — Telehealth: Payer: Self-pay | Admitting: Cardiology

## 2017-07-02 ENCOUNTER — Ambulatory Visit (INDEPENDENT_AMBULATORY_CARE_PROVIDER_SITE_OTHER): Payer: BLUE CROSS/BLUE SHIELD

## 2017-07-02 DIAGNOSIS — R0602 Shortness of breath: Secondary | ICD-10-CM

## 2017-07-02 DIAGNOSIS — R002 Palpitations: Secondary | ICD-10-CM

## 2017-07-02 DIAGNOSIS — R079 Chest pain, unspecified: Secondary | ICD-10-CM | POA: Diagnosis not present

## 2017-07-02 LAB — MYOCARDIAL PERFUSION IMAGING
CHL CUP NUCLEAR SDS: 2
CHL CUP NUCLEAR SRS: 0
CHL CUP RESTING HR STRESS: 60 {beats}/min
CSEPPHR: 101 {beats}/min
LV dias vol: 69 mL (ref 62–150)
LV sys vol: 33 mL
RATE: 0.31
SSS: 2
TID: 0.92

## 2017-07-02 MED ORDER — TECHNETIUM TC 99M TETROFOSMIN IV KIT
10.6000 | PACK | Freq: Once | INTRAVENOUS | Status: AC | PRN
  Administered 2017-07-02: 10.6 via INTRAVENOUS
  Filled 2017-07-02: qty 11

## 2017-07-02 MED ORDER — TECHNETIUM TC 99M TETROFOSMIN IV KIT
30.5000 | PACK | Freq: Once | INTRAVENOUS | Status: AC | PRN
  Administered 2017-07-02: 30.5 via INTRAVENOUS
  Filled 2017-07-02: qty 31

## 2017-07-02 MED ORDER — REGADENOSON 0.4 MG/5ML IV SOLN
0.4000 mg | Freq: Once | INTRAVENOUS | Status: AC
  Administered 2017-07-02: 0.4 mg via INTRAVENOUS

## 2017-07-02 NOTE — Telephone Encounter (Signed)
Follow Up: ° ° ° °Returning your call,cocnerning his Echo results. °

## 2017-07-02 NOTE — Telephone Encounter (Signed)
Informed son of echo results. (DPR on file) he verbalized understanding and thankful for the call

## 2017-07-20 ENCOUNTER — Encounter: Payer: Self-pay | Admitting: Neurology

## 2017-07-20 ENCOUNTER — Ambulatory Visit: Payer: BLUE CROSS/BLUE SHIELD | Admitting: Neurology

## 2017-07-20 DIAGNOSIS — R413 Other amnesia: Secondary | ICD-10-CM

## 2017-07-20 NOTE — Progress Notes (Signed)
Subjective:    Patient ID: Shawn Casey is a 62 y.o. male.  HPI     Shawn Foley, MD, PhD [facility] [address] [address] [place], [place] [address]  Dear Shawn Casey,  I saw your patient, Shawn Casey, your kind request in my neurologic clinic today for initial consultation of his memory loss. The patient is accompanied by his son and an interpreter today. As you know, Shawn Casey is a 62 year old right-handed gentleman with an underlying medical history of hypothyroidism, visual disturbance, and borderline overweight state, who is reported to have memory disturbance for the past few years. I reviewed your office note from 05/27/2017. He had some blood work in the recent past which I reviewed. A1c was unremarkable, TSH mildly elevated. He was recently started on thyroid medication. His son is not sure if he took the thyroid medication or where the prescription is for this. He does remember giving him 1 or 2 doses first and then giving him the bottle but the bottle is since then not to be found. His son reports a several year history of memory loss including forgetfulness but more recently in the past 5 or 6 months there have been some behavioral changes including crying at night. There is difficulty with sleep as well. Patient's family history is not fully known. Father died at 13 and apparently had depression but also memory issues according to patient's son. Patient is one of a total of 10 kids. He has 4 brothers and 5 sisters. He has 6 children. He did not go to school. 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 He quit smoking about 15 years ago, quit alcohol about 7 years ago. His appetite seems to fluctuate. He has not lost any significant amount of weight. He goes to bed around 9 or 10 but is often awake around midnight or 1 AM and seems upset and has been hollering out crying. He does not endorse any pain or discomfort or the year at the time, he  can go back to sleep and typically forgets that he woke up crying.  Patient does not complain of headaches. He has low back pain and knee pain typically. For this, his son reports he gives him over-the-counter ibuprofen or Tylenol with some success.  His Past Medical History Is Significant For: Past Medical History:  Diagnosis Date  . Hypothyroidism   . Memory loss     His Past Surgical History Is Significant For: No past surgical history on file.  His Family History Is Significant For: Family History  Problem Relation Age of Onset  . CAD Neg Hx     His Social History Is Significant For: Social History   Socioeconomic History  . Marital status: Married    Spouse name: None  . Number of children: None  . Years of education: None  . Highest education level: None  Social Needs  . Financial resource strain: None  . Food insecurity - worry: None  . Food insecurity - inability: None  . Transportation needs - medical: None  . Transportation needs - non-medical: None  Occupational History  . None  Tobacco Use  . Smoking status: Former Games developer  . Smokeless tobacco: Never Used  Substance and Sexual Activity  . Alcohol use: No  . Drug use: No  . Sexual activity: Not Currently  Other Topics Concern  . None  Social History Narrative  . None    His Allergies Are:  No Known Allergies:  His Current Medications Are:  No outpatient encounter medications on file as of 07/20/2017.   No facility-administered encounter medications on file as of 07/20/2017.   : Review of Systems:  Out of a complete 14 point review of systems, all are reviewed and negative with the exception of these symptoms as listed below:  Review of Systems  Neurological:       Pt presents today to discuss memory and behavior changes.    Objective:  Neurological Exam  Physical Exam Physical Examination:   Vitals:   07/20/17 1433  BP: (!) 156/75  Pulse: 82    General Examination: The patient is a  very pleasant 62 y.o. male in no acute distress. He is very quiet, he does answer in one or 2 word sentences to be interpreter. He has poor eye contact.    HEENT: Normocephalic, atraumatic, pupils are equal, round and reactive to light and accommodation. He wears corrective eyeglasses. Funduscopic exam was not possible. Patient does not hold still, extraocular tracking is impaired. Hearing seems intact. Speech is difficult to assess.  Airway examination is fairly unremarkable.   Chest: Clear to auscultation without wheezing, rhonchi or crackles noted.  Heart: S1+S2+0, regular and normal without murmurs, rubs or gallops noted.   Abdomen: Soft, non-tender and non-distended with normal bowel sounds appreciated on auscultation.  Extremities: There is no pitting edema in the distal lower extremities bilaterally. Pedal pulses are intact.  Skin: Warm and dry without trophic changes noted.  Musculoskeletal: exam reveals no obvious joint deformities, tenderness or joint swelling or erythema.   Neurologically:  Mental status: The patient is awake, seems distracted but follows simple commands. Mood and memory are difficult to assess. He is not able to participate in MMSE.  Motor exam: Thin bulk, global strength of 4+ out of 5, no tremor, Romberg is not tested. Fine motor skills are grossly intact. He has no obvious dysmetria. He seems to have normal sensation to light touch.  Gait, station and balance: He stands with difficulty and pushes himself up. He clutches his lower back and walks with a sibling clinic limp on the right. Posture is stooped for age and the lower back area with increase in lumbar kyphosis.  Assessment and Plan:  Assessment and Plan:  In summary, Shawn Casey is a very pleasant 62 y.o.-year old male with an underlying medical history of hypothyroidism, visual disturbance, and borderline overweight state, who presents for consultation of his memory loss. Assessment is  difficult, further testing may help including brain MRI which I will order. Furthermore, he was recently diagnosed with hypothyroidism and started on thyroid medication which he has either not completed or not refilled, unclear as his son did not know either. I advised his son to get in touch with your office. Patient is supposed to have blood work in the near future for recheck of his thyroid function. I would like for you to consider adding additional blood work at the time including vitamin D level and B12 and folate. We will call patient's son with the MRI results and consider dementia medication in the near future. I do believe that we need to get clarification on the thyroid function and thyroid medication first. I answered all his questions today and the patient's son was in agreement. Thank you very much for allowing me to participate in the care of this nice patient. If I can be of any further assistance to you please do not hesitate to call me at [phone].  Sincerely,   Star Age, MD, PhD

## 2017-07-20 NOTE — Patient Instructions (Addendum)
I would recommend, that you get adjusted to your thyroid medicine and also find out, what happened to the prescription and whether you need to continue. You will need follow up blood work, when you do, please ask your primary care to add vit D and B12 to the blood tests. For now, from my end of things, we will do a brain scan, called MRI and call you with the test results. We will have to schedule you for this on a separate date. This test requires authorization from your insurance, and we will take care of the insurance process. We will call you with the results.

## 2017-07-29 ENCOUNTER — Ambulatory Visit (INDEPENDENT_AMBULATORY_CARE_PROVIDER_SITE_OTHER): Payer: Self-pay | Admitting: Family Medicine

## 2017-07-29 ENCOUNTER — Encounter: Payer: Self-pay | Admitting: Family Medicine

## 2017-07-29 VITALS — BP 138/76 | HR 70 | Temp 97.6°F | Resp 14 | Ht 60.0 in | Wt 127.0 lb

## 2017-07-29 DIAGNOSIS — E039 Hypothyroidism, unspecified: Secondary | ICD-10-CM

## 2017-07-29 DIAGNOSIS — I10 Essential (primary) hypertension: Secondary | ICD-10-CM

## 2017-07-29 DIAGNOSIS — R413 Other amnesia: Secondary | ICD-10-CM

## 2017-07-29 DIAGNOSIS — R4189 Other symptoms and signs involving cognitive functions and awareness: Secondary | ICD-10-CM

## 2017-07-29 MED ORDER — LEVOTHYROXINE SODIUM 25 MCG PO TABS
25.0000 ug | ORAL_TABLET | Freq: Every day | ORAL | 1 refills | Status: DC
Start: 2017-07-29 — End: 2017-09-11

## 2017-07-29 MED ORDER — LISINOPRIL 10 MG PO TABS: 10.0000 mg | ORAL_TABLET | Freq: Every day | ORAL | 1 refills | Status: DC

## 2017-07-29 NOTE — Progress Notes (Signed)
[facility]  Patient ID: Shawn Casey, male    DOB: 1955-10-25, 62 y.o.   MRN: [id number]  PCP: Bing Neighbors, FNP  Chief Complaint  Patient presents with  . Follow-up    2 month on memory loss    Subjective:  HPI Shawn Casey is a 62 y.o. male with a history of levothyroxine, memory loss, chronic pain, presents for evaluation of hypothyroidism. He is accompanied by his daughter in law today who is assisting with translation. Since Shawn Casey's last office visit, he has followed up with neurology and was recommended to continue management of thyroid disorder and check B-12 and Vitamin D level. According to his daughter in law, patient never started levothyroxine as family member never conveyed message to pick-up medication. He continues to experience confusion and insomnia. Shawn Casey has also followed up with cardiology for chest pain and is currently wearing a Holter monitor which he will continue for 30 days. His blood pressure has persistently remain elevated over prior visits. He is not currently prescribed antihypertensive medication. No known prior history of hypertension. For pain, he is taking, Tylenol 650 mg as needed for pain and continues to take Gabapentin 300 mg at bedtime. Social History   Socioeconomic History  . Marital status: Married    Spouse name: Not on file  . Number of children: Not on file  . Years of education: Not on file  . Highest education level: Not on file  Social Needs  . Financial resource strain: Not on file  . Food insecurity - worry: Not on file  . Food insecurity - inability: Not on file  . Transportation needs - medical: Not on file  . Transportation needs - non-medical: Not on file  Occupational History  . Not on file  Tobacco Use  . Smoking status: Former Games developer  . Smokeless tobacco: Never Used  Substance and Sexual Activity  . Alcohol use: No  . Drug use: No  . Sexual activity: Not Currently  Other Topics Concern  . Not on  file  Social History Narrative  . Not on file    Family History  Problem Relation Age of Onset  . CAD Neg Hx    Review of Systems Constitutional: Positive for fatigue.  HENT: Negative.   Eyes: Positive for  visual disturbance. Negative for pain and itching.  Corrective lenses-unable to see anything through lenses   Respiratory: Negative.   Cardiovascular: Negative.   Genitourinary: Negative.   Musculoskeletal: Negative.   Skin: Negative.   Neurological: Negative for dizziness, seizures, syncope, facial asymmetry and weakness.  Impaired short-term and long-term memory Hematological: Negative.   Psychiatric/Behavioral: Positive for sleep disturbance.  Patient Active Problem List   Diagnosis Date Noted  . Memory loss   . Hypothyroidism   . Chest pain 02/23/2014  . Urticaria 02/23/2014    No Known Allergies  Prior to Admission medications   Not on File    Past Medical, Surgical Family and Social History reviewed and updated.    Objective:   Today's Vitals   07/29/17 1022  BP: 140/82  Pulse: 70  Resp: 14  Temp: 97.6 F (36.4 C)  TempSrc: Oral  SpO2: 99%  Weight: 127 lb (57.6 kg)  Height: 5' (1.524 m)    Wt Readings from Last 3 Encounters:  07/29/17 127 lb (57.6 kg)  07/20/17 126 lb (57.2 kg)  07/02/17 128 lb (58.1 kg)   Physical Exam Constitutional: He appears well-developed.  Thin appearance   HENT: Head: Normocephalic  and atraumatic.  Mouth/Throat: Oropharynx is clear and moist.  Eyes: Pupils are equal, round, and reactive to light. Scleral redness present   Neck: Normal range of motion. Neck supple.  Cardiovascular: Normal rate, regular rhythm, normal heart sounds and intact distal pulses.  Pulmonary/Chest: Effort normal and breath sounds normal.  Abdominal: Soft. Bowel sounds are normal.  Musculoskeletal: He exhibits tenderness. He exhibits no edema.       Lumbar back: He exhibits tenderness. He exhibits no bony tenderness and no edema.   Lymphadenopathy:    He has no cervical adenopathy.  Neurological: He is alert. No sensory deficit. He displays no seizure activity. Gait abnormal.  Antalgic gait- ambulates independently Oriented to self, location, unable to recall day, date, or year (uncertain if this is a cognitive issue or intellectual limitation)   Skin: Skin is warm and dry.  Psychiatric: He has a normal mood and affect. His behavior is normal. Judgment and thought content normal. Cognition and memory are impaired. Flat Affect     Assessment & Plan:  1. Hypothyroidism, unspecified type, patient never started medication therapy. Start Levothyroxine 25 mcg once daily every morning 30 minutes before meals. 2. Memory loss 3. Cognitive impairment Checking Vitamin D, 25-hydroxy and Vitamin B12, to evaluate for deficiency and whether this is contributing to memory impairment.  4. Essential hypertension, New -BP has been consistently elevated over the course of the last several months. Will trial patient lisinopril 10 mg once daily. Renal function stable. We have discussed target BP range and blood pressure goal. I have advised patient to check BP regularly and to call us back or report to clinic if the numbers are consistently higher than 140/90. We discussed the importance of compliance with medical therapy and DASH diet recommended, consequences of uncontrolled hypertension discussed.    Meds ordered this encounter  Medications  . levothyroxine (SYNTHROID, LEVOTHROID) 25 MCG tablet    Sig: Take 1 tablet (25 mcg total) by mouth daily before breakfast.    Dispense:  60 tablet    Refill:  1    Order Specific Question:   Supervising Provider    Answer:   Quentin AngstJEGEDE, OLUGBEMIGA E [id number]]  . lisinopril (PRINIVIL,ZESTRIL) 10 MG tablet    Sig: Take 1 tablet (10 mg total) by mouth daily.    Dispense:  90 tablet    Refill:  1    Order Specific Question:   Supervising Provider    Answer:   Quentin AngstJEGEDE, OLUGBEMIGA E [id number]]  . Vitamin  D, Ergocalciferol, (DRISDOL) 50000 units CAPS capsule    Sig: Take 1 capsule (50,000 Units total) by mouth every 7 (seven) days.    Dispense:  30 capsule    Refill:  1    Order Specific Question:   Supervising Provider    Answer:   Quentin AngstJEGEDE, OLUGBEMIGA E [id number]]     RTC: 6 weeks for BP follow-up and thyroid panel check  Godfrey PickKimberly S. Tiburcio PeaHarris, MSN, FNP-C [facility]  [address] [place], [place] [address] [phone]

## 2017-07-29 NOTE — Patient Instructions (Addendum)
I am starting medication for elevated blood pressure today.  I would like for you to start lisinopril 10 mg once daily.  You will return in 6 weeks for a blood pressure recheck.  Goal blood pressure is less than 130/90. For insomnia continue gabapentin 300 mg at bedtime and you may add melatonin which can be purchased over-the-counter at any retail store however start with 1 tablet and may advance to 2 tablets if needed to induce drowsiness. Start levothyroxine 25 mcg for hypothyroidism.  Levothyroxine should be taken first thing in the morning 30 minutes prior to eating.  You will return in 6 weeks to have a repeat thyroid level checked.    Insomnia Insomnia is a sleep disorder that makes it difficult to fall asleep or to stay asleep. Insomnia can cause tiredness (fatigue), low energy, difficulty concentrating, mood swings, and poor performance at work or school. There are three different ways to classify insomnia:  Difficulty falling asleep.  Difficulty staying asleep.  Waking up too early in the morning.  Any type of insomnia can be long-term (chronic) or short-term (acute). Both are common. Short-term insomnia usually lasts for three months or less. Chronic insomnia occurs at least three times a week for longer than three months. What are the causes? Insomnia may be caused by another condition, situation, or substance, such as:  Anxiety.  Certain medicines.  Gastroesophageal reflux disease (GERD) or other gastrointestinal conditions.  Asthma or other breathing conditions.  Restless legs syndrome, sleep apnea, or other sleep disorders.  Chronic pain.  Menopause. This may include hot flashes.  Stroke.  Abuse of alcohol, tobacco, or illegal drugs.  Depression.  Caffeine.  Neurological disorders, such as Alzheimer disease.  An overactive thyroid (hyperthyroidism).  The cause of insomnia may not be known. What increases the risk? Risk factors for insomnia  include:  Gender. Women are more commonly affected than men.  Age. Insomnia is more common as you get older.  Stress. This may involve your professional or personal life.  Income. Insomnia is more common in people with lower income.  Lack of exercise.  Irregular work schedule or night shifts.  Traveling between different time zones.  What are the signs or symptoms? If you have insomnia, trouble falling asleep or trouble staying asleep is the main symptom. This may lead to other symptoms, such as:  Feeling fatigued.  Feeling nervous about going to sleep.  Not feeling rested in the morning.  Having trouble concentrating.  Feeling irritable, anxious, or depressed.  How is this treated? Treatment for insomnia depends on the cause. If your insomnia is caused by an underlying condition, treatment will focus on addressing the condition. Treatment may also include:  Medicines to help you sleep.  Counseling or therapy.  Lifestyle adjustments.  Follow these instructions at home:  Take medicines only as directed by your health care provider.  Keep regular sleeping and waking hours. Avoid naps.  Keep a sleep diary to help you and your health care provider figure out what could be causing your insomnia. Include: ? When you sleep. ? When you wake up during the night. ? How well you sleep. ? How rested you feel the next day. ? Any side effects of medicines you are taking. ? What you eat and drink.  Make your bedroom a comfortable place where it is easy to fall asleep: ? Put up shades or special blackout curtains to block light from outside. ? Use a white noise machine to block noise. ?  Keep the temperature cool.  Exercise regularly as directed by your health care provider. Avoid exercising right before bedtime.  Use relaxation techniques to manage stress. Ask your health care provider to suggest some techniques that may work well for you. These may include: ? Breathing  exercises. ? Routines to release muscle tension. ? Visualizing peaceful scenes.  Cut back on alcohol, caffeinated beverages, and cigarettes, especially close to bedtime. These can disrupt your sleep.  Do not overeat or eat spicy foods right before bedtime. This can lead to digestive discomfort that can make it hard for you to sleep.  Limit screen use before bedtime. This includes: ? Watching TV. ? Using your smartphone, tablet, and computer.  Stick to a routine. This can help you fall asleep faster. Try to do a quiet activity, brush your teeth, and go to bed at the same time each night.  Get out of bed if you are still awake after 15 minutes of trying to sleep. Keep the lights down, but try reading or doing a quiet activity. When you feel sleepy, go back to bed.  Make sure that you drive carefully. Avoid driving if you feel very sleepy.  Keep all follow-up appointments as directed by your health care provider. This is important. Contact a health care provider if:  You are tired throughout the day or have trouble in your daily routine due to sleepiness.  You continue to have sleep problems or your sleep problems get worse. Get help right away if:  You have serious thoughts about hurting yourself or someone else. This information is not intended to replace advice given to you by your health care provider. Make sure you discuss any questions you have with your health care provider. Document Released: 05/23/2000 Document Revised: 10/26/2015 Document Reviewed: 02/24/2014 Elsevier Interactive Patient Education  [employer].

## 2017-07-30 LAB — VITAMIN B12: VITAMIN B 12: 443 pg/mL (ref 232–1245)

## 2017-07-30 LAB — VITAMIN D 25 HYDROXY (VIT D DEFICIENCY, FRACTURES): VIT D 25 HYDROXY: 9.8 ng/mL — AB (ref 30.0–100.0)

## 2017-07-30 MED ORDER — VITAMIN D (ERGOCALCIFEROL) 1.25 MG (50000 UNIT) PO CAPS: 50000.0000 [IU] | ORAL_CAPSULE | ORAL | 1 refills | Status: DC

## 2017-09-09 ENCOUNTER — Other Ambulatory Visit: Payer: Self-pay

## 2017-09-09 ENCOUNTER — Other Ambulatory Visit: Payer: Medicaid Other

## 2017-09-09 DIAGNOSIS — E039 Hypothyroidism, unspecified: Secondary | ICD-10-CM

## 2017-09-09 MED ORDER — LISINOPRIL 10 MG PO TABS: 10.0000 mg | ORAL_TABLET | Freq: Every day | ORAL | 1 refills | Status: DC

## 2017-09-09 NOTE — Telephone Encounter (Signed)
Medication was resent as pharmacy didn't have it

## 2017-09-10 LAB — THYROID PANEL WITH TSH
Free Thyroxine Index: 1.5 (ref 1.2–4.9)
T3 Uptake Ratio: 27 % (ref 24–39)
T4 TOTAL: 5.5 ug/dL (ref 4.5–12.0)
TSH: 6.91 u[IU]/mL — ABNORMAL HIGH (ref 0.450–4.500)

## 2017-09-11 ENCOUNTER — Encounter: Payer: Self-pay | Admitting: Family Medicine

## 2017-09-11 ENCOUNTER — Ambulatory Visit (INDEPENDENT_AMBULATORY_CARE_PROVIDER_SITE_OTHER): Payer: Self-pay | Admitting: Family Medicine

## 2017-09-11 VITALS — BP 144/76 | HR 65 | Temp 97.4°F | Ht 60.0 in | Wt 129.0 lb

## 2017-09-11 DIAGNOSIS — E039 Hypothyroidism, unspecified: Secondary | ICD-10-CM

## 2017-09-11 DIAGNOSIS — I10 Essential (primary) hypertension: Secondary | ICD-10-CM

## 2017-09-11 LAB — POCT URINALYSIS DIP (DEVICE)
BILIRUBIN URINE: NEGATIVE
Glucose, UA: NEGATIVE mg/dL
Hgb urine dipstick: NEGATIVE
Ketones, ur: NEGATIVE mg/dL
Leukocytes, UA: NEGATIVE
Nitrite: NEGATIVE
PH: 5.5 (ref 5.0–8.0)
Protein, ur: NEGATIVE mg/dL
Specific Gravity, Urine: 1.01 (ref 1.005–1.030)
UROBILINOGEN UA: 0.2 mg/dL (ref 0.0–1.0)

## 2017-09-11 MED ORDER — LISINOPRIL 10 MG PO TABS: 20.0000 mg | ORAL_TABLET | Freq: Every day | ORAL | 1 refills | Status: DC

## 2017-09-11 MED ORDER — LEVOTHYROXINE SODIUM 25 MCG PO TABS: 50.0000 ug | ORAL_TABLET | Freq: Every day | ORAL | 1 refills | Status: DC

## 2017-09-11 NOTE — Patient Instructions (Addendum)
Levothyroxine 50 mcg once daily with breakfast. Hypertension, I am increasing your Lisinopril 20 mg daily. For sleep, start benadryl 50 mg one hour before bedtime time. Return in 6 weeks for repeat thyroid test.

## 2017-09-11 NOTE — Progress Notes (Signed)
Patient ID: Shawn Casey, male    DOB: 02/11/1956, 62 y.o.   MRN: [id number]  PCP: Bing NeighborsHarris, Naria Abbey S, FNP  Chief Complaint  Patient presents with  . Follow-up    blood pressure     Subjective:  HPI Shawn SchaumannKrishna Bahadur Casey is a 62 y.o. male with hypertension and hypothyroid disorder, presents for evaluation of recent TSH and hypertension follow-up. Last TSH, 2 days ago- level remained nontherapeutic at 6.910. His granddaughter who is accompany him during today's visit reports that he is consistently taking Levothyroxine daily in the morning without food for at least 30 minutes. He continues to experience cognitive, deconditioning, and impairment of performing ADL according to family. Patient continues to experience insomnia and has taken a dose of benadryl for sleep which did not help although the milligram of benadryl was unknown. He  Presents today, also for follow-up of hypertension. He was recently prescribed lisinopril for BP management back in February. BP has remained slight above goal at recent visits. He denies any recent worsening dizziness, headaches, chest pain, or SOB. He is scheduled to follow-up with neurology in May.   Social History   Socioeconomic History  . Marital status: Married    Spouse name: Not on file  . Number of children: Not on file  . Years of education: Not on file  . Highest education level: Not on file  Occupational History  . Not on file  Social Needs  . Financial resource strain: Not on file  . Food insecurity:    Worry: Not on file    Inability: Not on file  . Transportation needs:    Medical: Not on file    Non-medical: Not on file  Tobacco Use  . Smoking status: Former Games developermoker  . Smokeless tobacco: Never Used  Substance and Sexual Activity  . Alcohol use: No  . Drug use: No  . Sexual activity: Not Currently  Lifestyle  . Physical activity:    Days per week: Not on file    Minutes per session: Not on file  . Stress: Not on  file  Relationships  . Social connections:    Talks on phone: Not on file    Gets together: Not on file    Attends religious service: Not on file    Active member of club or organization: Not on file    Attends meetings of clubs or organizations: Not on file    Relationship status: Not on file  . Intimate partner violence:    Fear of current or ex partner: Not on file    Emotionally abused: Not on file    Physically abused: Not on file    Forced sexual activity: Not on file  Other Topics Concern  . Not on file  Social History Narrative  . Not on file    Family History  Problem Relation Age of Onset  . CAD Neg Hx    Review of Systems  Pertinent negatives listed in HPI.  Patient Active Problem List   Diagnosis Date Noted  . Memory loss   . Hypothyroidism   . Chest pain 02/23/2014  . 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 Authorizing Provider  levothyroxine (SYNTHROID, LEVOTHROID) 25 MCG tablet Take 1 tablet (25 mcg total) by mouth daily before breakfast. 07/29/17  Yes Bing NeighborsHarris, Pietro Bonura S, FNP  lisinopril (PRINIVIL,ZESTRIL) 10 MG tablet Take 1 tablet (10 mg total) by  mouth daily. 09/09/17  Yes Bing Neighbors, FNP  Vitamin D, Ergocalciferol, (DRISDOL) 50000 units CAPS capsule Take 1 capsule (50,000 Units total) by mouth every 7 (seven) days. 07/30/17  Yes Bing Neighbors, FNP    Past Medical, Surgical Family and Social History reviewed and updated.    Objective:   Today's Vitals   09/11/17 1016 09/11/17 1031  BP: (!) 160/68 (!) 144/76  Pulse: 65   Temp: (!) 97.4 F (36.3 C)   TempSrc: Oral   SpO2: 100%   Weight: 129 lb (58.5 kg)   Height: 5' (1.524 m)     Wt Readings from Last 3 Encounters:  09/11/17 129 lb (58.5 kg)  07/29/17 127 lb (57.6 kg)  07/20/17 126 lb (57.2 kg)    Physical Exam Physical Exam Constitutional: He appearswell-developed. Thin appearance HENT:  Head:Normocephalicand atraumatic.  Mouth/Throat:Oropharynx is clear and moist.  Eyes:Pupils are equal, round, and reactive to light.Scleral redness present Neck:Normal range of motion.Neck supple.  Cardiovascular:Normal rate,regular rhythm,normal heart soundsand intact distal pulses.  Pulmonary/Chest:Effort normaland breath sounds normal.  Abdominal:Soft.Bowel sounds are normal.  Musculoskeletal: ROM is normal. no tenderness or edema  Neurological: He isalert. Nosensory deficit. He displaysno seizure activity.Gaitabnormal.  Skin: Skin iswarmand dry.  Psychiatric: He has anormal mood and affect. Hisbehavior is normal.Judgmentand thought contentnormal. Cognition and memory are impaired.Flat Affect    Assessment & Plan:  1. Hypothyroidism, unspecified type, patient never started medication therapy. Start Levothyroxine 25 mcg once daily every morning 30 minutes before meals  2. Essential hypertension, New -BP has been consistently elevated over the course of the last several months. Started on  lisinopril 10 mg once daily. BP continues to remain elevated. Increasing lisinopril 20 mg once daily. Renal function stable. We have discussed target BP range and blood pressure goal. I have advised patient to check BP regularly and to call us back or report to clinic if the numbers are consistently higher than 140/90. We discussed the importance of compliance with medical therapy and DASH diet recommended, consequences of uncontrolled hypertension discussed.   3. Insomnia, recommend diphenhydramine 50 mg 1 hour prior to bedtime. Due to alterations in memory and diminished cognitive function, I opted not to trial stronger medications.  For neurological dysfunction continue close follow-up with neurology.   Godfrey Pick. Tiburcio Pea, MSN, FNP-C [facility]  [address] [place], [place] [address] [phone]

## 2017-10-19 ENCOUNTER — Ambulatory Visit (INDEPENDENT_AMBULATORY_CARE_PROVIDER_SITE_OTHER): Payer: BLUE CROSS/BLUE SHIELD | Admitting: Neurology

## 2017-10-19 ENCOUNTER — Encounter: Payer: Self-pay | Admitting: Neurology

## 2017-10-19 VITALS — BP 132/78 | HR 72 | Ht 60.0 in | Wt 127.0 lb

## 2017-10-19 DIAGNOSIS — E039 Hypothyroidism, unspecified: Secondary | ICD-10-CM

## 2017-10-19 DIAGNOSIS — R413 Other amnesia: Secondary | ICD-10-CM

## 2017-10-19 NOTE — Progress Notes (Signed)
Subjective:    Patient ID: Shawn Casey is a 62 y.o. male.  HPI     Interim history:  Mr. Hobby is a 62 year old right-handed gentleman with an underlying medical history of hypothyroidism, visual disturbance, and borderline overweight state, who presents for follow-up consultation of his memory loss. The patient is accompanied by his son today, they declined interpreter services. I first met him on 07/20/2017 at the request of his primary care provider, at which time his son reported a several year history of memory loss. Patient had recently been diagnosed with hypothyroidism but was not taking his thyroid medication. I suggested we proceed with a brain MRI. He did not have a brain MRI. He had an interim follow-up with his primary care provider in April at which time his TSH was still high, he had not been taking his thyroid medication from what I understand.   Today, 10/19/2017: Patient reports very little. His son reports the patient has been taking the Synthroid 2 pills every day, he has an empty bottle with him today, date field was 09/11/2017, son reports that he ran out yesterday. I also saw that the patient had low vitamin D level back in February 2019 in the single digits, he was supposed to have a prescription vitamin D 50,000 units daily, had 30 pills but patient is no longer on it. Son reports that he only filled a partial prescription at [facility] as they did not have enough of it but when he called them to fill the rest of his he did not get it. Son reports no interim significant changes in patient's medical history, has had intermittent issues with dry patchy lesion on the right scalp, has a history of infection in that area when he was a child as I understand. May have been folliculitis. Patient reports intermittent pain in that area, maybe headache.  The patient's allergies, current medications, family history, past medical history, past social history, past surgical  history and problem list were reviewed and updated as appropriate.   Previously (copied from previous notes for reference):   07/20/2017: (He) is reported to have memory disturbance for the past few years. I reviewed your office note from 05/27/2017. He had some blood work in the recent past which I reviewed. A1c was unremarkable, TSH mildly elevated. He was recently started on thyroid medication. His son is not sure if he took the thyroid medication or where the prescription is for this. He does remember giving him 1 or 2 doses first and then giving him the bottle but the bottle is since then not to be found. His son reports a several year history of memory loss including forgetfulness but more recently in the past 5 or 6 months there have been some behavioral changes including crying at night. There is difficulty with sleep as well. Patient's family history is not fully known. Father died at 80 and apparently had depression but also memory issues according to patient's son. Patient is one of a total of 10 kids. He has 4 brothers and 5 sisters. He has 6 children. He did not go to school. He worked in a [employer] and [employer]. He quit smoking about 15 years ago, quit alcohol about 7 years ago. His appetite seems to fluctuate. He has not lost any significant amount of weight. He goes to bed around 9 or 10 but is often awake around midnight or 1 AM and seems upset and has been hollering out crying. He does not endorse any  pain or discomfort or the year at the time, he can go back to sleep and typically forgets that he woke up crying.  Patient does not complain of headaches. He has low back pain and knee pain typically. For this, his son reports he gives him over-the-counter ibuprofen or Tylenol with some success.  His Past Medical History Is Significant For: Past Medical History:  Diagnosis Date  . Hypothyroidism   . Memory loss     His Past Surgical History Is Significant For: No past surgical history  on file.  His Family History Is Significant For: Family History  Problem Relation Age of Onset  . CAD Neg Hx     His Social History Is Significant For: Social History   Socioeconomic History  . Marital status: Married    Spouse name: Not on file  . Number of children: Not on file  . Years of education: Not on file  . Highest education level: Not on file  Occupational History  . Not on file  Social Needs  . Financial resource strain: Not on file  . Food insecurity:    Worry: Not on file    Inability: Not on file  . Transportation needs:    Medical: Not on file    Non-medical: Not on file  Tobacco Use  . Smoking status: Former Research scientist (life sciences)  . Smokeless tobacco: Never Used  Substance and Sexual Activity  . Alcohol use: No  . Drug use: No  . Sexual activity: Not Currently  Lifestyle  . Physical activity:    Days per week: Not on file    Minutes per session: Not on file  . Stress: Not on file  Relationships  . Social connections:    Talks on phone: Not on file    Gets together: Not on file    Attends religious service: Not on file    Active member of club or organization: Not on file    Attends meetings of clubs or organizations: Not on file    Relationship status: Not on file  Other Topics Concern  . Not on file  Social History Narrative  . Not on file    His Allergies Are:  No Known Allergies:   His Current Medications Are:  Outpatient Encounter Medications as of 10/19/2017  Medication Sig  . levothyroxine (SYNTHROID, LEVOTHROID) 25 MCG tablet Take 2 tablets (50 mcg total) by mouth daily before breakfast.  . lisinopril (PRINIVIL,ZESTRIL) 10 MG tablet Take 2 tablets (20 mg total) by mouth daily.  . Vitamin D, Ergocalciferol, (DRISDOL) 50000 units CAPS capsule Take 1 capsule (50,000 Units total) by mouth every 7 (seven) days.   No facility-administered encounter medications on file as of 10/19/2017.   :  Review of Systems:  Out of a complete 14 point review of  systems, all are reviewed and negative with the exception of these symptoms as listed below: Review of Systems  Neurological:       Pt presents today to follow up. Pt's son reports that pt has had no changes. Pt's son reports that the pt was never contacted for his MRI.    Objective:  Neurological Exam  Physical Exam Physical Examination:   Vitals:   10/19/17 1515  BP: 132/78  Pulse: 72    General Examination: The patient is a very pleasant 62 y.o. male in no acute distress. He appears well-developed and well-nourished and well groomed.   HEENT: Normocephalic, atraumatic, pupils are equal, round and reactive to light and accommodation.  He wears corrective eyeglasses. Dry patchy scalp lesion R parietal scalp, prior scarring. Extraocular tracking is impaired. Hearing seems intact. Speech is difficult to assess, seems clear, son has no concerns. Airway examination is fairly unremarkable.   Chest: Clear to auscultation without wheezing, rhonchi or crackles noted.  Heart: S1+S2+0, regular and normal without murmurs, rubs or gallops noted.   Abdomen: Soft, non-tender and non-distended with normal bowel sounds appreciated on auscultation.  Extremities: There is no pitting edema in the distal lower extremities bilaterally.   Skin: Warm and dry without trophic changes noted.  Musculoskeletal: exam reveals no obvious joint deformities, tenderness or joint swelling or erythema.   Neurologically:  Mental status: The patient is awake, seems distracted at times, pays fair attention, but follows simple commands. Mood and memory are difficult to assess. He is not able to participate in MMSE.  Motor exam: Thin bulk, global strength of 4+ out of 5, no tremor, Romberg is not tested. Fine motor skills are grossly intact. He has no obvious dysmetria. He seems to have normal sensation to light touch.  Gait, station and balance: He stands with no significant difficulty. Posture is stooped for  age and the lower back area with increase in lumbar kyphosis. Walks without limp.   Assessment and Plan:    In summary, Deyon Chizek is a 62 year old male with an underlying medical history of hypothyroidism, visual disturbance, and borderline overweight state, who presents for follow up consultation of his memory loss of unclear etiology and duration. Assessment is difficult, further testing may help, including brain MRI, I ordered it in February; the son is willing to pursue it. He has Been out of his thyroid medication, they are encouraged to fill the prescription, the thyroid prescription was increased from 25 g to 50 g about a month ago when his TSH was still high. He was also diagnosed with very low vitamin D level and prescribed prescription strength vitamin D 50,000 units weekly, 30 pills but patient only took it for maybe a few weeks, unclear how long, certainly not 30 pills that would last him over 6 months and prescription was from February 2019. His son encouraged to call the pharmacy regarding the vitamin D prescription as well as for refill on the thyroid medicine. We will call patient's son with the MRI results and consider dementia medication in the future if feasible, so long as his metabolic/endocrinologic issues are under control as well. I suggested a 3-4 month follow-up with one of our nurse practitioners. I answered all his questions today and the patient's son was in agreement. I spent 20 minutes in total face-to-face time with the patient, more than 50% of which was spent in counseling and coordination of care, reviewing test results, reviewing medication and discussing or reviewing the diagnosis of memory loss, its prognosis and treatment options. Pertinent laboratory and imaging test results that were available during this visit with the patient were reviewed by me and considered in my medical decision making (see chart for details).

## 2017-10-19 NOTE — Patient Instructions (Addendum)
Please call [facility] to schedule the brain MRI: [phone].   Please refill your thyroid medication, as you are out of it.   Please call your pharmacy about your Vitamin D prescription, 50,000 units every week.   You may benefit from seeing a dermatologist (skin doctor) for the scalp lesion on the right side.

## 2017-10-23 ENCOUNTER — Other Ambulatory Visit: Payer: Medicaid Other

## 2017-10-27 ENCOUNTER — Ambulatory Visit: Payer: Self-pay | Admitting: Family Medicine

## 2017-10-27 ENCOUNTER — Ambulatory Visit
Admission: RE | Admit: 2017-10-27 | Discharge: 2017-10-27 | Disposition: A | Discharge status: Home / Self Care | Payer: Medicaid Other | Source: Ambulatory Visit | Attending: Neurology | Admitting: Neurology

## 2017-10-27 DIAGNOSIS — R413 Other amnesia: Secondary | ICD-10-CM

## 2017-11-04 ENCOUNTER — Telehealth: Payer: Self-pay

## 2017-11-04 DIAGNOSIS — R9089 Other abnormal findings on diagnostic imaging of central nervous system: Secondary | ICD-10-CM

## 2017-11-04 DIAGNOSIS — R4189 Other symptoms and signs involving cognitive functions and awareness: Secondary | ICD-10-CM

## 2017-11-04 DIAGNOSIS — Q040 Congenital malformations of corpus callosum: Secondary | ICD-10-CM

## 2017-11-04 NOTE — Addendum Note (Signed)
Addended by: Huston Foley on: 11/04/2017 04:53 PM   Modules accepted: Orders

## 2017-11-04 NOTE — Telephone Encounter (Signed)
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 Sometimes the fluid collection does get worse with time and gets bigger. I would recommend evaluation at an academic Medical Center such as [facility] or [facility] or [facility] especially evaluation through a neurosurgical clinic as he may be a candidate for a neurosurgical procedure to reduce some of the fluid on the brain.  His memory loss may have something to do with the fluid on his brain and the developmental changes. Therefore, I would also recommend that he see a neurologist at a academic center, therefore, I would like to make referrals to neurosurgery and neurology at either [place], [facility] or [facility]. Please let me know which clinic or University they would like to go to.

## 2017-11-04 NOTE — Telephone Encounter (Addendum)
Pt's son, Tera Mater, per [facility], returned my call. I explained pt's MRI results to pt's son. Pt's son is agreeable to a referral to [facility] for a neurological and neurosurgical consultation to discuss treatment options for this fluid on the brain noted on the MRI. Pt's son verbalized understanding of results. Pt's son had no questions at this time but was encouraged to call back if questions arise.

## 2017-11-04 NOTE — Telephone Encounter (Signed)
Patient's son is returning your call.

## 2017-11-04 NOTE — Telephone Encounter (Signed)
I would like to refer to [facility], [facility] and neurology. Please process referrals. Thank you.

## 2017-11-04 NOTE — Telephone Encounter (Signed)
I called pt's son, Tera Mater, per [facility], to discuss pt's MRI results. No answer, left a message asking him to call me back.

## 2017-11-04 NOTE — Telephone Encounter (Signed)
I called pt's son to discuss pt's MRI results. No answer, left a message asking him to call me back.

## 2017-11-04 NOTE — Progress Notes (Signed)
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 Sometimes the fluid collection does get worse with time and gets bigger. I would recommend evaluation at an academic Medical Center such as [facility] or [facility] or [facility] especially evaluation through a neurosurgical clinic as he may be a candidate for a neurosurgical procedure to reduce some of the fluid on the brain.  His memory loss may have something to do with the fluid on his brain and the developmental changes. Therefore, I would also recommend that he see a neurologist at a academic center, therefore, I would like to make referrals to neurosurgery and neurology at either [facility], [facility] or [facility]. Please let me know which clinic or University they would like to go to.

## 2017-11-10 NOTE — Telephone Encounter (Signed)
Referral has been sent.

## 2017-11-19 ENCOUNTER — Other Ambulatory Visit: Payer: Self-pay | Admitting: Family Medicine

## 2018-01-19 ENCOUNTER — Telehealth: Payer: Self-pay

## 2018-01-19 NOTE — Telephone Encounter (Signed)
Received an incoming phone call from Dr. Drema Balzarinerey Bateman, neurologist at [facility]. Dr. Lajean SilviusBateman saw this pt recently and wanted to update Dr. Frances FurbishAthar on his thought and findings. Dr. Lajean SilviusBateman reports that pt has a complicated story. Pt reported to Dr. Lajean SilviusBateman that he has had quite a lot of trauma in his life; seeing his family murdered and raped, he watched his child die of starvation, etc. Dr. Lajean SilviusBateman believes pt has severe PTSD and his symptoms neurologically may be from a psych presentation. Dr. Lajean SilviusBateman says that a neurodegenerative dx is impossible to rule out. Dr. Lajean SilviusBateman recommends aggressive treatment of his neuropsych problems. He does not think pt's history correlates with dementia. 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 Dr. Lajean SilviusBateman has started pt on zoloft, and when pt follows up in one month, the zoloft may be increased based on pt's response. Dr. Lajean SilviusBateman is also researching therapy services for refugees in [place], but it is imperative that the services have a strong Publishing copyepali translator.  Dr. Lajean SilviusBateman just wanted to give Dr. Frances FurbishAthar an update. If she has any questions or concerns, he has provided his cell phone number [phone].

## 2018-01-21 ENCOUNTER — Other Ambulatory Visit: Payer: Self-pay | Admitting: Family Medicine

## 2018-01-24 ENCOUNTER — Encounter: Payer: Self-pay | Admitting: Family Medicine

## 2018-01-24 NOTE — Progress Notes (Signed)
Patient was recently evaluated by Dr. Huston FoleySaima Athar at [facility] for evaluation of memory loss.  Office notes received 01/22/2018.

## 2018-01-28 ENCOUNTER — Other Ambulatory Visit: Payer: Self-pay | Admitting: Family Medicine

## 2018-01-30 ENCOUNTER — Telehealth: Payer: Self-pay | Admitting: Family Medicine

## 2018-01-30 NOTE — Telephone Encounter (Signed)
Patient is requesting refill on Levothyroxine. Last refill was 11/2017. His last Thyroid levels were elevated. He needs follow up appointment to re-evaluate Thyroid level with possible dose adjustment of medication.  Unable to reach patient at given number. Voice message left on daughter's voicemail to have patient to follow up as soon as possible.

## 2018-02-16 ENCOUNTER — Ambulatory Visit (INDEPENDENT_AMBULATORY_CARE_PROVIDER_SITE_OTHER): Payer: Medicaid Other | Admitting: Family Medicine

## 2018-02-16 ENCOUNTER — Encounter: Payer: Self-pay | Admitting: Family Medicine

## 2018-02-16 VITALS — BP 144/80 | HR 74 | Temp 98.0°F | Ht 60.0 in | Wt 128.0 lb

## 2018-02-16 DIAGNOSIS — I10 Essential (primary) hypertension: Secondary | ICD-10-CM

## 2018-02-16 DIAGNOSIS — G8929 Other chronic pain: Secondary | ICD-10-CM

## 2018-02-16 DIAGNOSIS — R413 Other amnesia: Secondary | ICD-10-CM | POA: Diagnosis not present

## 2018-02-16 DIAGNOSIS — F419 Anxiety disorder, unspecified: Secondary | ICD-10-CM

## 2018-02-16 DIAGNOSIS — F329 Major depressive disorder, single episode, unspecified: Secondary | ICD-10-CM

## 2018-02-16 DIAGNOSIS — E039 Hypothyroidism, unspecified: Secondary | ICD-10-CM

## 2018-02-16 DIAGNOSIS — Z131 Encounter for screening for diabetes mellitus: Secondary | ICD-10-CM | POA: Diagnosis not present

## 2018-02-16 DIAGNOSIS — Z09 Encounter for follow-up examination after completed treatment for conditions other than malignant neoplasm: Secondary | ICD-10-CM

## 2018-02-16 DIAGNOSIS — G4700 Insomnia, unspecified: Secondary | ICD-10-CM

## 2018-02-16 DIAGNOSIS — M5441 Lumbago with sciatica, right side: Secondary | ICD-10-CM

## 2018-02-16 LAB — POCT URINALYSIS DIP (MANUAL ENTRY)
Bilirubin, UA: NEGATIVE
Blood, UA: NEGATIVE
Glucose, UA: NEGATIVE mg/dL
Ketones, POC UA: NEGATIVE mg/dL
Leukocytes, UA: NEGATIVE
Nitrite, UA: NEGATIVE
Protein Ur, POC: NEGATIVE mg/dL
Spec Grav, UA: 1.015 (ref 1.010–1.025)
Urobilinogen, UA: 0.2 E.U./dL
pH, UA: 5 (ref 5.0–8.0)

## 2018-02-16 LAB — POCT GLYCOSYLATED HEMOGLOBIN (HGB A1C): Hemoglobin A1C: 5.1 % (ref 4.0–5.6)

## 2018-02-16 MED ORDER — LEVOTHYROXINE SODIUM 50 MCG PO TABS: 50.0000 ug | ORAL_TABLET | Freq: Every day | ORAL | 3 refills | Status: DC

## 2018-02-16 MED ORDER — NAPROXEN 500 MG PO TABS: 500.0000 mg | ORAL_TABLET | Freq: Two times a day (BID) | ORAL | 3 refills | Status: DC

## 2018-02-16 MED ORDER — LISINOPRIL 10 MG PO TABS: 10.0000 mg | ORAL_TABLET | Freq: Every day | ORAL | 3 refills | Status: DC

## 2018-02-16 MED ORDER — TRAZODONE HCL 50 MG PO TABS: 25.0000 mg | ORAL_TABLET | Freq: Every evening | ORAL | 3 refills | Status: DC | PRN

## 2018-02-16 MED ORDER — BUSPIRONE HCL 5 MG PO TABS: 5.0000 mg | ORAL_TABLET | Freq: Three times a day (TID) | ORAL | 1 refills | Status: DC

## 2018-02-16 NOTE — Progress Notes (Signed)
Follow Up   Subjective:    Patient ID: Shawn Casey, male    DOB: Nov 08, 1955, 62 y.o.   MRN: [id number]   Chief Complaint  Patient presents with  . Follow-up    chronic condition   HPI  Shawn Casey is a 62 year old male with a past medical history of Memory Loss and Hypothyroidism. He is here today for follow up.   Current Status: He is accompanied by his daughter-in-law today. Since his last office visit, he has mild memory loss. He experiences periods of agitation and depression, r/t memory loss. Reports visual changes, which he has recently had an appointment with Optometry. He has chronic back pain, which he uses Acetaminophen. He has not been sleeping well.   He denies fevers, chills, fatigue, recent infections, weight loss, and night sweats. He has not had any headaches, dizziness, and falls. No chest pain, heart palpitations, cough and shortness of breath reported. No reports of GI problems such as nausea, vomiting, diarrhea, and constipation. He has no reports of blood in stools, dysuria and hematuria.   Past Medical History:  Diagnosis Date  . Hypothyroidism   . Memory loss     Family History  Problem Relation Age of Onset  . CAD Neg Hx     Social History   Socioeconomic History  . Marital status: Married    Spouse name: Not on file  . Number of children: Not on file  . Years of education: Not on file  . Highest education level: Not on file  Occupational History  . Not on file  Social Needs  . Financial resource strain: Not on file  . Food insecurity:    Worry: Not on file    Inability: Not on file  . Transportation needs:    Medical: Not on file    Non-medical: Not on file  Tobacco Use  . Smoking status: Former Games developer  . Smokeless tobacco: Never Used  Substance and Sexual Activity  . Alcohol use: No  . Drug use: No  . Sexual activity: Not Currently  Lifestyle  . Physical activity:    Days per week: Not on file    Minutes per session: Not on  file  . Stress: Not on file  Relationships  . Social connections:    Talks on phone: Not on file    Gets together: Not on file    Attends religious service: Not on file    Active member of club or organization: Not on file    Attends meetings of clubs or organizations: Not on file    Relationship status: Not on file  . Intimate partner violence:    Fear of current or ex partner: Not on file    Emotionally abused: Not on file    Physically abused: Not on file    Forced sexual activity: Not on file  Other Topics Concern  . Not on file  Social History Narrative  . Not on file    History reviewed. No pertinent surgical history.  There is no immunization history for the selected administration types on file for this patient.  Current Meds  Medication Sig  . sertraline (ZOLOFT) 50 MG tablet Take 50 mg by mouth.  . Vitamin D, Ergocalciferol, (DRISDOL) 50000 units CAPS capsule Take 1 capsule (50,000 Units total) by mouth every 7 (seven) days.  . [DISCONTINUED] lisinopril (PRINIVIL,ZESTRIL) 10 MG tablet Take 2 tablets (20 mg total) by mouth daily.    No Known Allergies  BP (!) 144/80 (BP Location: Left Arm, Patient Position: Sitting, Cuff Size: Small)   Pulse 74   Temp 98 F (36.7 C) (Oral)   Ht 5' (1.524 m)   Wt 128 lb (58.1 kg)   SpO2 98%   BMI 25.00 kg/m   Review of Systems  Constitutional: Negative.   HENT: Negative.   Eyes: Positive for redness, itching and visual disturbance (blurrry vision).  Respiratory: Positive for shortness of breath.   Cardiovascular: Negative.   Gastrointestinal: Negative.   Genitourinary: Negative.   Musculoskeletal: Positive for arthralgias (Generalized- occasional) and back pain (Occasional).  Allergic/Immunologic: Negative.   Neurological: Positive for dizziness (occasional) and headaches (occasional).  Hematological: Negative.   Psychiatric/Behavioral: Positive for agitation (daily), confusion and sleep disturbance (insomnia).        Memory loss   Objective:   Physical Exam  Constitutional: He appears well-developed and well-nourished.  Cardiovascular: Normal rate, regular rhythm, normal heart sounds and intact distal pulses.  Pulmonary/Chest: Effort normal and breath sounds normal.  Abdominal: Soft. Bowel sounds are normal.  Musculoskeletal:  Limited ROM in spine  Skin: Skin is warm and dry.  Psychiatric: He has a normal mood and affect. His behavior is normal. Judgment and thought content normal.  Nursing note and vitals reviewed.  Assessment & Plan:   1. Screening for diabetes mellitus Hgb A1c is within normal levels at 5.1 today. He will continue to decrease foods/beverages high in sugars and carbs and follow Heart Healthy or DASH diet. Increase physical activity to at least 30 minutes cardio exercise daily.   - POCT glycosylated hemoglobin (Hb A1C) - POCT urinalysis dipstick  2. Essential hypertension Blood pressure is stable at 144/80 today. He will continue Lisinopril as prescribed.  - lisinopril (PRINIVIL,ZESTRIL) 10 MG tablet; Take 1 tablet (10 mg total) by mouth daily.  Dispense: 30 tablet; Refill: 3  3. Hypothyroidism, unspecified type We will re-assess TSH level in 1 week.  - levothyroxine (SYNTHROID, LEVOTHROID) 50 MCG tablet; Take 1 tablet (50 mcg total) by mouth daily before breakfast.  Dispense: 30 tablet; Refill: 3  4. Memory loss Use of herbal or dietary supplements including ginseng, ginkgo biloba extract, and glucosamine to aide in memory.   5. Chronic right-sided low back pain with right-sided sciatica We will initiate Naproxen. He will take if Acetaminophen is not effective in pain relief.  6. Insomnia, unspecified type We will initiate Trazodone to aide with sleep.   7. Anxiety and depression We will initiate trial of Buspar today to aide with depression and anxiety. Monitor.   8. Follow up He will follow up in 1 month.   Meds ordered this encounter  Medications  .  levothyroxine (SYNTHROID, LEVOTHROID) 50 MCG tablet    Sig: Take 1 tablet (50 mcg total) by mouth daily before breakfast.    Dispense:  30 tablet    Refill:  3  . lisinopril (PRINIVIL,ZESTRIL) 10 MG tablet    Sig: Take 1 tablet (10 mg total) by mouth daily.    Dispense:  30 tablet    Refill:  3  . traZODone (DESYREL) 50 MG tablet    Sig: Take 0.5-1 tablets (25-50 mg total) by mouth at bedtime as needed for sleep.    Dispense:  30 tablet    Refill:  3  . busPIRone (BUSPAR) 5 MG tablet    Sig: Take 1 tablet (5 mg total) by mouth 3 (three) times daily.    Dispense:  60 tablet    Refill:  1  . naproxen (NAPROSYN) 500 MG tablet    Sig: Take 1 tablet (500 mg total) by mouth 2 (two) times daily with a meal.    Dispense:  60 tablet    Refill:  3    Raliegh Ip,  MSN, FNP-C [facility] [address] [place], [place] [address] [phone]

## 2018-02-16 NOTE — Patient Instructions (Addendum)
Trazodone tablets What is this medicine? TRAZODONE (TRAZ oh done) is used to treat depression. This medicine may be used for other purposes; ask your health care provider or pharmacist if you have questions. COMMON BRAND NAME(S): Desyrel What should I tell my health care provider before I take this medicine? They need to know if you have any of these conditions: -attempted suicide or thinking about it -bipolar disorder -bleeding problems -glaucoma -heart disease, or previous heart attack -irregular heart beat -kidney or liver disease -low levels of sodium in the blood -an unusual or allergic reaction to trazodone, other medicines, foods, dyes or preservatives -pregnant or trying to get pregnant -breast-feeding How should I use this medicine? Take this medicine by mouth with a glass of water. Follow the directions on the prescription label. Take this medicine shortly after a meal or a light snack. Take your medicine at regular intervals. Do not take your medicine more often than directed. Do not stop taking this medicine suddenly except upon the advice of your doctor. Stopping this medicine too quickly may cause serious side effects or your condition may worsen. A special MedGuide will be given to you by the pharmacist with each prescription and refill. Be sure to read this information carefully each time. Talk to your pediatrician regarding the use of this medicine in children. Special care may be needed. Overdosage: If you think you have taken too much of this medicine contact a poison control center or emergency room at once. NOTE: This medicine is only for you. Do not share this medicine with others. What if I miss a dose? If you miss a dose, take it as soon as you can. If it is almost time for your next dose, take only that dose. Do not take double or extra doses. What may interact with this medicine? Do not take this medicine with any of the following medications: -certain medicines  for fungal infections like fluconazole, itraconazole, ketoconazole, posaconazole, voriconazole -cisapride -dofetilide -dronedarone -linezolid -MAOIs like Carbex, Eldepryl, Marplan, Nardil, and Parnate -mesoridazine -methylene blue (injected into a vein) -pimozide -saquinavir -thioridazine -ziprasidone This medicine may also interact with the following medications: -alcohol -antiviral medicines for HIV or AIDS -aspirin and aspirin-like medicines -barbiturates like phenobarbital -certain medicines for blood pressure, heart disease, irregular heart beat -certain medicines for depression, anxiety, or psychotic disturbances -certain medicines for migraine headache like almotriptan, eletriptan, frovatriptan, naratriptan, rizatriptan, sumatriptan, zolmitriptan -certain medicines for seizures like carbamazepine and phenytoin -certain medicines for sleep -certain medicines that treat or prevent blood clots like dalteparin, enoxaparin, warfarin -digoxin -fentanyl -lithium -NSAIDS, medicines for pain and inflammation, like ibuprofen or naproxen -other medicines that prolong the QT interval (cause an abnormal heart rhythm) -rasagiline -supplements like St. John's wort, kava kava, valerian -tramadol -tryptophan This list may not describe all possible interactions. Give your health care provider a list of all the medicines, herbs, non-prescription drugs, or dietary supplements you use. Also tell them if you smoke, drink alcohol, or use illegal drugs. Some items may interact with your medicine. What should I watch for while using this medicine? Tell your doctor if your symptoms do not get better or if they get worse. Visit your doctor or health care professional for regular checks on your progress. Because it may take several weeks to see the full effects of this medicine, it is important to continue your treatment as prescribed by your doctor. Patients and their families should watch out for new  or worsening thoughts of suicide or depression. Also   watch out for sudden changes in feelings such as feeling anxious, agitated, panicky, irritable, hostile, aggressive, impulsive, severely restless, overly excited and hyperactive, or not being able to sleep. If this happens, especially at the beginning of treatment or after a change in dose, call your health care professional. Shawn Casey may get drowsy or dizzy. Do not drive, use machinery, or do anything that needs mental alertness until you know how this medicine affects you. Do not stand or sit up quickly, especially if you are an older patient. This reduces the risk of dizzy or fainting spells. Alcohol may interfere with the effect of this medicine. Avoid alcoholic drinks. This medicine may cause dry eyes and blurred vision. If you wear contact lenses you may feel some discomfort. Lubricating drops may help. See your eye doctor if the problem does not go away or is severe. Your mouth may get dry. Chewing sugarless gum, sucking hard candy and drinking plenty of water may help. Contact your doctor if the problem does not go away or is severe. What side effects may I notice from receiving this medicine? Side effects that you should report to your doctor or health care professional as soon as possible: -allergic reactions like skin rash, itching or hives, swelling of the face, lips, or tongue -elevated mood, decreased need for sleep, racing thoughts, impulsive behavior -confusion -fast, irregular heartbeat -feeling faint or lightheaded, falls -feeling agitated, angry, or irritable -loss of balance or coordination -painful or prolonged erections -restlessness, pacing, inability to keep still -suicidal thoughts or other mood changes -tremors -trouble sleeping -seizures -unusual bleeding or bruising Side effects that usually do not require medical attention (report to your doctor or health care professional if they continue or are bothersome): -change in  sex drive or performance -change in appetite or weight -constipation -headache -muscle aches or pains -nausea This list may not describe all possible side effects. Call your doctor for medical advice about side effects. You may report side effects to FDA at 1-800-FDA-1088. Where should I keep my medicine? Keep out of the reach of children. Store at room temperature between 15 and 30 degrees C (59 to 86 degrees F). Protect from light. Keep container tightly closed. Throw away any unused medicine after the expiration date. NOTE: This sheet is a summary. It may not cover all possible information. If you have questions about this medicine, talk to your doctor, pharmacist, or health care provider.  2018 Elsevier/Gold Standard (2015-10-25 16:57:05) Naproxen Sodium oral tablet, extended-release What is this medicine? NAPROXEN (na PROX en) is a non-steroidal anti-inflammatory drug (NSAID). It is used to reduce swelling and to treat pain. This medicine may be used for dental pain, headache, or painful monthly periods. It is also used for painful joint and muscular problems such as arthritis, tendinitis, bursitis, and gout. This medicine may be used for other purposes; ask your health care provider or pharmacist if you have questions. COMMON BRAND NAME(S): Midol Extended Relief, Naprelan Dose Card What should I tell my health care provider before I take this medicine? They need to know if you have any of these conditions: -asthma -cigarette smoker -drink more than 3 alcohol containing drinks a day -heart disease or circulation problems such as heart failure or leg edema (fluid retention) -high blood pressure -kidney disease -liver disease -stomach bleeding or ulcers -an unusual or allergic reaction to naproxen, aspirin, other NSAIDs, other medicines, foods, dyes, or preservatives -pregnant or trying to get pregnant -breast-feeding How should I use this medicine? Take this  medicine by mouth with  a glass of water. Follow the directions on the prescription label. Take this medicine with food if it upsets your stomach. Try to not lie down for at least 10 minutes after you take it. Take your medicine at regular intervals. Do not take your medicine more often than directed. Long-term, continuous use may increase the risk of heart attack or stroke. A special MedGuide will be given to you by the pharmacist with each prescription and refill. Be sure to read this information carefully each time. Talk to your pediatrician regarding the use of this medicine in children. Special care may be needed. Overdosage: If you think you have taken too much of this medicine contact a poison control center or emergency room at once. NOTE: This medicine is only for you. Do not share this medicine with others. What if I miss a dose? If you miss a dose, take it as soon as you can. If it is almost time for your next dose, take only that dose. Do not take double or extra doses. What may interact with this medicine? -alcohol -aspirin -cidofovir -diuretics -lithium -methotrexate -other drugs for inflammation like ketorolac or prednisone -pemetrexed -probenecid -warfarin This list may not describe all possible interactions. Give your health care provider a list of all the medicines, herbs, non-prescription drugs, or dietary supplements you use. Also tell them if you smoke, drink alcohol, or use illegal drugs. Some items may interact with your medicine. What should I watch for while using this medicine? Tell your doctor or health care professional if your pain does not get better. Talk to your doctor before taking another medicine for pain. Do not treat yourself. This medicine does not prevent heart attack or stroke. In fact, this medicine may increase the chance of a heart attack or stroke. The chance may increase with longer use of this medicine and in people who have heart disease. If you take aspirin to prevent  heart attack or stroke, talk with your doctor or health care professional. Do not take other medicines that contain aspirin, ibuprofen, or naproxen with this medicine. Side effects such as stomach upset, nausea, or ulcers may be more likely to occur. Many medicines available without a prescription should not be taken with this medicine. This medicine can cause ulcers and bleeding in the stomach and intestines at any time during treatment. Do not smoke cigarettes or drink alcohol. These increase irritation to your stomach and can make it more susceptible to damage from this medicine. Ulcers and bleeding can happen without warning symptoms and can cause death. You may get drowsy or dizzy. Do not drive, use machinery, or do anything that needs mental alertness until you know how this medicine affects you. Do not stand or sit up quickly, especially if you are an older patient. This reduces the risk of dizzy or fainting spells. This medicine can cause you to bleed more easily. Try to avoid damage to your teeth and gums when you brush or floss your teeth. What side effects may I notice from receiving this medicine? Side effects that you should report to your doctor or health care professional as soon as possible: -black or bloody stools, blood in the urine or vomit -blurred vision -chest pain -difficulty breathing or wheezing -nausea or vomiting -severe stomach pain -skin rash, skin redness, blistering or peeling skin, hives, or itching -slurred speech or weakness on one side of the body -swelling of eyelids, throat, lips -unexplained weight gain or swelling -unusually  weak or tired -yellowing of eyes or skin Side effects that usually do not require medical attention (report to your doctor or health care professional if they continue or are bothersome): -constipation -headache -heartburn This list may not describe all possible side effects. Call your doctor for medical advice about side effects. You  may report side effects to FDA at 1-800-FDA-1088. Where should I keep my medicine? Keep out of the reach of children. Store at room temperature between 20 and 25 degrees C (68 and 77 degrees F). Keep container tightly closed. Throw away any unused medicine after the expiration date. NOTE: This sheet is a summary. It may not cover all possible information. If you have questions about this medicine, talk to your doctor, pharmacist, or health care provider.  2018 Elsevier/Gold Standard (2009-05-28 20:26:54) Buspirone tablets What is this medicine? BUSPIRONE (byoo SPYE rone) is used to treat anxiety disorders. This medicine may be used for other purposes; ask your health care provider or pharmacist if you have questions. COMMON BRAND NAME(S): BuSpar What should I tell my health care provider before I take this medicine? They need to know if you have any of these conditions: -kidney or liver disease -an unusual or allergic reaction to buspirone, other medicines, foods, dyes, or preservatives -pregnant or trying to get pregnant -breast-feeding How should I use this medicine? Take this medicine by mouth with a glass of water. Follow the directions on the prescription label. You may take this medicine with or without food. To ensure that this medicine always works the same way for you, you should take it either always with or always without food. Take your doses at regular intervals. Do not take your medicine more often than directed. Do not stop taking except on the advice of your doctor or health care professional. Talk to your pediatrician regarding the use of this medicine in children. Special care may be needed. Overdosage: If you think you have taken too much of this medicine contact a poison control center or emergency room at once. NOTE: This medicine is only for you. Do not share this medicine with others. What if I miss a dose? If you miss a dose, take it as soon as you can. If it is almost  time for your next dose, take only that dose. Do not take double or extra doses. What may interact with this medicine? Do not take this medicine with any of the following medications: -linezolid -MAOIs like Carbex, Eldepryl, Marplan, Nardil, and Parnate -methylene blue -procarbazine This medicine may also interact with the following medications: -diazepam -digoxin -diltiazem -erythromycin -grapefruit juice -haloperidol -medicines for mental depression or mood problems -medicines for seizures like carbamazepine, phenobarbital and phenytoin -nefazodone -other medications for anxiety -rifampin -ritonavir -some antifungal medicines like itraconazole, ketoconazole, and voriconazole -verapamil -warfarin This list may not describe all possible interactions. Give your health care provider a list of all the medicines, herbs, non-prescription drugs, or dietary supplements you use. Also tell them if you smoke, drink alcohol, or use illegal drugs. Some items may interact with your medicine. What should I watch for while using this medicine? Visit your doctor or health care professional for regular checks on your progress. It may take 1 to 2 weeks before your anxiety gets better. You may get drowsy or dizzy. Do not drive, use machinery, or do anything that needs mental alertness until you know how this drug affects you. Do not stand or sit up quickly, especially if you are an older patient. This  reduces the risk of dizzy or fainting spells. Alcohol can make you more drowsy and dizzy. Avoid alcoholic drinks. What side effects may I notice from receiving this medicine? Side effects that you should report to your doctor or health care professional as soon as possible: -blurred vision or other vision changes -chest pain -confusion -difficulty breathing -feelings of hostility or anger -muscle aches and pains -numbness or tingling in hands or feet -ringing in the ears -skin rash and  itching -vomiting -weakness Side effects that usually do not require medical attention (report to your doctor or health care professional if they continue or are bothersome): -disturbed dreams, nightmares -headache -nausea -restlessness or nervousness -sore throat and nasal congestion -stomach upset This list may not describe all possible side effects. Call your doctor for medical advice about side effects. You may report side effects to FDA at 1-800-FDA-1088. Where should I keep my medicine? Keep out of the reach of children. Store at room temperature below 30 degrees C (86 degrees F). Protect from light. Keep container tightly closed. Throw away any unused medicine after the expiration date. NOTE: This sheet is a summary. It may not cover all possible information. If you have questions about this medicine, talk to your doctor, pharmacist, or health care provider.  2018 Elsevier/Gold Standard (2010-01-03 18:06:11)

## 2018-02-17 ENCOUNTER — Telehealth: Payer: Self-pay

## 2018-02-17 NOTE — Telephone Encounter (Signed)
-----   Message from Natalie M Stroud, FNP sent at 02/16/2018 11:02 PM EDT ----- Regarding: "New Rxs" Carrie,  Please inform patient's daughter that we have sent these medications to pharmacy:  1) Rx Trazodone for sleep  2) Rx Buspar for anxiety/depression symptoms  3) OTC med use of herbal or dietary supplements examples: Ginseng, Ginkgo Biloba Extract, and Glucosamine for Memory Loss. May add prescribed medication if these are not effective. (she can choose either one).   3) Rx for Naproxen for back pain. He should take Naproxen if Acetaminophen is not effective. He is NOT to take both Acetaminophen and Naproxen.   Also inform her that we will be calling her to schedule an appointment to draw labs to assess for basic labs and Thyroid levels. We will review for possible dose adjustment to Synthroid.    Thanks so much Carrie! 

## 2018-02-17 NOTE — Telephone Encounter (Signed)
Left a vm for patient to callback 

## 2018-02-20 ENCOUNTER — Encounter: Payer: Self-pay | Admitting: Family Medicine

## 2018-02-22 NOTE — Telephone Encounter (Signed)
-----   Message from Kallie LocksNatalie M Stroud, FNP sent at 02/16/2018 11:02 PM EDT ----- Regarding: "New Rxs" Lyla Sonarrie,  Please inform patient's daughter that we have sent these medications to pharmacy:  1) Rx Trazodone for sleep  2) Rx Buspar for anxiety/depression symptoms  3) OTC med use of herbal or dietary supplements examples: Ginseng, Ginkgo Biloba Extract, and Glucosamine for Memory Loss. May add prescribed medication if these are not effective. (she can choose either one).   3) Rx for Naproxen for back pain. He should take Naproxen if Acetaminophen is not effective. He is NOT to take both Acetaminophen and Naproxen.   Also inform her that we will be calling her to schedule an appointment to draw labs to assess for basic labs and Thyroid levels. We will review for possible dose adjustment to Synthroid.    Thanks so much Pinearrie!

## 2018-02-22 NOTE — Progress Notes (Addendum)
[facility]  PATIENT: Shawn Casey DOB: Oct 21, 1955   REASON FOR VISIT: Follow-up for memory loss HISTORY FROM: Son who interprets    HISTORY OF PRESENT ILLNESS: 10/19/17 Shawn Casey is a 62 year old right-handed gentleman with an underlying medical history of hypothyroidism, visual disturbance, and borderline overweight state, who presents for follow-up consultation of his memory loss. The patient is accompanied by his son today, they declined interpreter services. I first met him on 07/20/2017 at the request of his primary care provider, at which time his son reported a several year history of memory loss. Patient had recently been diagnosed with hypothyroidism but was not taking his thyroid medication. I suggested we proceed with a brain MRI. He did not have a brain MRI. He had an interim follow-up with his primary care provider in April at which time his TSH was still high, he had not been taking his thyroid medication from what I understand.   Today, 10/19/2017: Patient reports very little. His son reports the patient has been taking the Synthroid 2 pills every day, he has an empty bottle with him today, date field was 09/11/2017, son reports that he ran out yesterday. I also saw that the patient had low vitamin D level back in February 2019 in the single digits, he was supposed to have a prescription vitamin D 50,000 units daily, had 30 pills but patient is no longer on it. Son reports that he only filled a partial prescription at [facility] as they did not have enough of it but when he called them to fill the rest of his he did not get it. Son reports no interim significant changes in patient's medical history, has had intermittent issues with dry patchy lesion on the right scalp, has a history of infection in that area when he was a child as I understand. May have been folliculitis. Patient reports intermittent pain in that area, maybe headache. UPDATE  9/17/2019CM Shawn Casey, 62 year old male returns for follow-up with history of memory loss.  After his last visit MRI was ordered. 10/29/17 Abnormal MRI brain (without) demonstrating: 1.  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 There is some mass-effect upon the adjacent bilateral frontal lobes.  2.  Mild to moderate ventriculomegaly. 3.  No acute findings. He was given a  Referral.to [facility].  He was seen by Dr. Rondel Oh at [facility].  See phone note from 01/19/2018 but essentially patient has had a lot of trauma in his life and Dr. Rondel Oh believes he is suffering from PTSD and he recommends aggressive treatment of his neuropsych problems.  He does not think his history correlates with dementia and that his MRI is a developmental abnormality.  Son who is with his dad today can provide little information but does note that his next appointment with Dr. Rondel Oh is on November 15 at 3:30 in the afternoon.  Patient returns for reevaluation REVIEW OF SYSTEMS: Full 14 system review of systems performed and notable only for those listed, all others are neg:  Constitutional: neg  Cardiovascular: neg Ear/Nose/Throat: Hearing loss Skin: neg Eyes: neg Respiratory: neg Gastroitestinal: neg  Hematology/Lymphatic: neg  Endocrine: neg Musculoskeletal:neg Allergy/Immunology: neg Neurological: Memory loss Psychiatric: Confusion Sleep : neg   ALLERGIES: No Known Allergies  HOME MEDICATIONS: Outpatient Medications Prior to Visit  Medication Sig Dispense Refill  . lisinopril (PRINIVIL,ZESTRIL) 10 MG tablet Take 1 tablet (10 mg total) by mouth daily. 30 tablet 3  . naproxen (NAPROSYN)  500 MG tablet Take 1 tablet (500 mg total) by mouth 2 (two) times daily with a meal. 60 tablet 3  . Vitamin D, Ergocalciferol, (DRISDOL) 50000 units CAPS capsule Take 1 capsule (50,000 Units total) by mouth every 7 (seven) days. 30 capsule 1  .  busPIRone (BUSPAR) 5 MG tablet Take 1 tablet (5 mg total) by mouth 3 (three) times daily. (Patient not taking: Reported on 02/23/2018) 60 tablet 1  . levothyroxine (SYNTHROID, LEVOTHROID) 50 MCG tablet Take 1 tablet (50 mcg total) by mouth daily before breakfast. (Patient not taking: Reported on 02/23/2018) 30 tablet 3  . sertraline (ZOLOFT) 50 MG tablet Take 50 mg by mouth.    . traZODone (DESYREL) 50 MG tablet Take 0.5-1 tablets (25-50 mg total) by mouth at bedtime as needed for sleep. (Patient not taking: Reported on 02/23/2018) 30 tablet 3   No facility-administered medications prior to visit.     PAST MEDICAL HISTORY: Past Medical History:  Diagnosis Date  . Anxiety and depression   . Chronic low back pain with right-sided sciatica   . Hypertension   . Hypothyroidism   . Insomnia   . Memory loss     PAST SURGICAL HISTORY: History reviewed. No pertinent surgical history.  FAMILY HISTORY: Family History  Problem Relation Age of Onset  . CAD Neg Hx     SOCIAL HISTORY: Social History   Socioeconomic History  . Marital status: Married    Spouse name: Not on file  . Number of children: Not on file  . Years of education: Not on file  . Highest education level: Not on file  Occupational History  . Not on file  Social Needs  . Financial resource strain: Not on file  . Food insecurity:    Worry: Not on file    Inability: Not on file  . Transportation needs:    Medical: Not on file    Non-medical: Not on file  Tobacco Use  . Smoking status: Former Research scientist (life sciences)  . Smokeless tobacco: Never Used  Substance and Sexual Activity  . Alcohol use: No  . Drug use: No  . Sexual activity: Not Currently  Lifestyle  . Physical activity:    Days per week: Not on file    Minutes per session: Not on file  . Stress: Not on file  Relationships  . Social connections:    Talks on phone: Not on file    Gets together: Not on file    Attends religious service: Not on file    Active member of  club or organization: Not on file    Attends meetings of clubs or organizations: Not on file    Relationship status: Not on file  . Intimate partner violence:    Fear of current or ex partner: Not on file    Emotionally abused: Not on file    Physically abused: Not on file    Forced sexual activity: Not on file  Other Topics Concern  . Not on file  Social History Narrative  . Not on file     PHYSICAL EXAM  Vitals:   02/23/18 0957  BP: (!) 147/73  Pulse: 63  Weight: 129 lb 12.8 oz (58.9 kg)  Height: 5' (1.524 m)   Body mass index is 25.35 kg/m.  Generalized: Well developed, in no acute distress , well groomed Head: normocephalic and atraumatic,. Oropharynx benign  Neck: Supple,  Musculoskeletal: No deformity   Neurological examination   Mentation: Alert  Easily distracted unable  to perform MMSE.   Follows some commands.  Speech difficult to assess.  Cranial nerve II-XII: Pupils were equal round reactive to light extraocular movements impaired. Facial sensation and strength were normal. hearing was intact to finger rubbing bilaterally. Uvula tongue midline. head turning and shoulder shrug were normal and symmetric.Tongue protrusion into cheek strength was normal. Motor: normal bulk and tone, full strength in the BUE, BLE,  Sensory: normal and symmetric to light touch,  Coordination:  no dysmetria Gait and Station: Rising up from seated position without assistance, stooped posture walks with a limp no assistive device DIAGNOSTIC DATA (LABS, IMAGING, TESTING) - I reviewed patient records, labs, notes, testing and imaging myself where available.  Lab Results  Component Value Date   WBC 4.9 05/11/2017   HGB 14.1 05/11/2017   HCT 41.7 05/11/2017   MCV 79 05/11/2017   PLT 142 (L) 05/11/2017      Component Value Date/Time   NA 144 05/11/2017 1106   K 4.3 05/11/2017 1106   CL 105 05/11/2017 1106   CO2 24 05/11/2017 1106   GLUCOSE 84 05/11/2017 1106   GLUCOSE 88  02/23/2014 1337   BUN 17 05/11/2017 1106   CREATININE 1.11 05/11/2017 1106   CALCIUM 9.4 05/11/2017 1106   PROT 7.7 05/11/2017 1106   ALBUMIN 4.1 05/11/2017 1106   AST 34 05/11/2017 1106   ALT 35 05/11/2017 1106   ALKPHOS 120 (H) 05/11/2017 1106   BILITOT 0.4 05/11/2017 1106   GFRNONAA 70 05/11/2017 1106   GFRAA 81 05/11/2017 1106   Lab Results  Component Value Date   CHOL 158 02/24/2014   HDL 28 (L) 02/24/2014   LDLCALC 102 (H) 02/24/2014   TRIG 138 02/24/2014   CHOLHDL 5.6 02/24/2014   Lab Results  Component Value Date   HGBA1C 5.1 02/16/2018   Lab Results  Component Value Date   VITAMINB12 443 07/29/2017   Lab Results  Component Value Date   TSH 6.910 (H) 09/09/2017      ASSESSMENT AND PLAN  Shawn Heninger Mainaliis a 9 year oldmalewith an underlying medical history of hypothyroidism, visual disturbance, and borderline overweight state, whopresents for follow up consultation of his memory loss of unclear etiology and duration. Assessment is difficult, he has been evaluated by Dr[facility] Rondel [facility] at [facility] who believes he suffers from PTSD and needs intensive psychotherapy.  He does not think this is dementia  PLAN: Discussed with Dr. Rexene Alberts discussed current medications with son  Made him aware no further follow-up needed at this office Patient to be managed by Dr. Rondel Oh in [place] Please make sure he is taking his levothyroxine He has refills for his sertraline Shawn Casey, [facility], [facility], [facility] [address], [place], [place] [address] [phone]  I reviewed the above note and documentation by the Nurse Practitioner and agree with the history, physical exam, assessment and plan as outlined above. I was immediately available for face-to-face consultation. Star Age, MD, PhD [facility])

## 2018-02-22 NOTE — Telephone Encounter (Signed)
Patient daughter notified

## 2018-02-23 ENCOUNTER — Ambulatory Visit: Payer: Medicaid Other | Admitting: Nurse Practitioner

## 2018-02-23 ENCOUNTER — Encounter: Payer: Self-pay | Admitting: Nurse Practitioner

## 2018-02-23 VITALS — BP 147/73 | HR 63 | Ht 60.0 in | Wt 129.8 lb

## 2018-02-23 DIAGNOSIS — F4310 Post-traumatic stress disorder, unspecified: Secondary | ICD-10-CM

## 2018-02-23 DIAGNOSIS — R413 Other amnesia: Secondary | ICD-10-CM | POA: Diagnosis not present

## 2018-02-23 NOTE — Patient Instructions (Signed)
No further follow-up at this office Patient to be managed by Dr. Lajean [place]Bateman [place] Please make sure he is taking his levothyroxine He has refills for his sertraline

## 2018-03-15 ENCOUNTER — Ambulatory Visit: Payer: Medicaid Other | Admitting: Family Medicine

## 2018-06-01 ENCOUNTER — Other Ambulatory Visit: Payer: Self-pay | Admitting: Family Medicine

## 2018-06-01 DIAGNOSIS — G8929 Other chronic pain: Secondary | ICD-10-CM

## 2018-06-01 DIAGNOSIS — M5441 Lumbago with sciatica, right side: Principal | ICD-10-CM

## 2018-07-01 ENCOUNTER — Other Ambulatory Visit: Payer: Self-pay | Admitting: Family Medicine

## 2018-07-01 DIAGNOSIS — E039 Hypothyroidism, unspecified: Secondary | ICD-10-CM

## 2018-08-31 ENCOUNTER — Other Ambulatory Visit: Payer: Self-pay | Admitting: Family Medicine

## 2018-08-31 DIAGNOSIS — I10 Essential (primary) hypertension: Secondary | ICD-10-CM

## 2019-01-25 ENCOUNTER — Other Ambulatory Visit: Payer: Self-pay

## 2019-01-25 ENCOUNTER — Encounter: Payer: Self-pay | Admitting: Family Medicine

## 2019-01-25 ENCOUNTER — Ambulatory Visit (INDEPENDENT_AMBULATORY_CARE_PROVIDER_SITE_OTHER): Payer: Medicaid Other | Admitting: Family Medicine

## 2019-01-25 VITALS — BP 152/99 | HR 77 | Temp 99.4°F | Resp 14 | Ht 60.0 in | Wt 128.0 lb

## 2019-01-25 DIAGNOSIS — E559 Vitamin D deficiency, unspecified: Secondary | ICD-10-CM

## 2019-01-25 DIAGNOSIS — M62838 Other muscle spasm: Secondary | ICD-10-CM | POA: Diagnosis not present

## 2019-01-25 DIAGNOSIS — R413 Other amnesia: Secondary | ICD-10-CM

## 2019-01-25 DIAGNOSIS — M5441 Lumbago with sciatica, right side: Secondary | ICD-10-CM | POA: Diagnosis not present

## 2019-01-25 DIAGNOSIS — Z789 Other specified health status: Secondary | ICD-10-CM

## 2019-01-25 DIAGNOSIS — F329 Major depressive disorder, single episode, unspecified: Secondary | ICD-10-CM

## 2019-01-25 DIAGNOSIS — I10 Essential (primary) hypertension: Secondary | ICD-10-CM | POA: Diagnosis not present

## 2019-01-25 DIAGNOSIS — F419 Anxiety disorder, unspecified: Secondary | ICD-10-CM

## 2019-01-25 DIAGNOSIS — Z09 Encounter for follow-up examination after completed treatment for conditions other than malignant neoplasm: Secondary | ICD-10-CM

## 2019-01-25 DIAGNOSIS — G8929 Other chronic pain: Secondary | ICD-10-CM

## 2019-01-25 DIAGNOSIS — R4189 Other symptoms and signs involving cognitive functions and awareness: Secondary | ICD-10-CM

## 2019-01-25 DIAGNOSIS — Z7409 Other reduced mobility: Secondary | ICD-10-CM

## 2019-01-25 DIAGNOSIS — E039 Hypothyroidism, unspecified: Secondary | ICD-10-CM | POA: Diagnosis not present

## 2019-01-25 DIAGNOSIS — Z758 Other problems related to medical facilities and other health care: Secondary | ICD-10-CM

## 2019-01-25 LAB — POCT URINALYSIS DIPSTICK
Bilirubin, UA: NEGATIVE
Blood, UA: NEGATIVE
Glucose, UA: NEGATIVE
Ketones, UA: NEGATIVE
Leukocytes, UA: NEGATIVE
Nitrite, UA: NEGATIVE
Protein, UA: NEGATIVE
Spec Grav, UA: 1.015 (ref 1.010–1.025)
Urobilinogen, UA: 0.2 E.U./dL
pH, UA: 5.5 (ref 5.0–8.0)

## 2019-01-25 MED ORDER — BUSPIRONE HCL 15 MG PO TABS: 15.0000 mg | ORAL_TABLET | Freq: Two times a day (BID) | ORAL | 3 refills | Status: DC

## 2019-01-25 MED ORDER — CYCLOBENZAPRINE HCL 10 MG PO TABS: 10.0000 mg | ORAL_TABLET | Freq: Two times a day (BID) | ORAL | 0 refills | Status: DC | PRN

## 2019-01-25 MED ORDER — VITAMIN D (ERGOCALCIFEROL) 1.25 MG (50000 UNIT) PO CAPS: 50000.0000 [IU] | ORAL_CAPSULE | ORAL | 1 refills | Status: DC

## 2019-01-25 MED ORDER — LISINOPRIL 10 MG PO TABS: 10.0000 mg | ORAL_TABLET | Freq: Every day | ORAL | 1 refills | Status: DC

## 2019-01-25 MED ORDER — LEVOTHYROXINE SODIUM 50 MCG PO TABS: 50.0000 ug | ORAL_TABLET | Freq: Every day | ORAL | 1 refills | Status: DC

## 2019-01-25 NOTE — Progress Notes (Signed)
[facility]   Established Patient Office Visit  Subjective:  Patient ID: Shawn Casey, male    DOB: [birth year]  Age: 63 y.o. MRN: [id number]  CC:  Chief Complaint  Patient presents with  . Hip Pain    right   . Knee Pain    right  . Shoulder Pain    right   . Medication Refill    thyroid, bp,  and vitamin D     HPI Shawn Casey is a 63 year old male who presents for follow up today.   Past Medical History:  Diagnosis Date  . Anxiety and depression   . Chronic low back pain with right-sided sciatica   . Hypertension   . Hypothyroidism   . Insomnia   . Memory loss    Current Status: Since his last office visit, he is doing well with no complaints. His son is here today to interpret for patient. He has c/o right shoulder, knee, and hip pain, which he is taking Acetaminophen with minimal relief.  He denies visual changes, chest pain, cough, shortness of breath, heart palpitations, and falls. He has occasional headaches and dizziness with position changes. Denies severe headaches, confusion, seizures, double vision, and blurred vision, nausea and vomiting. His anxiety is mil today. He denies suicidal ideations, homicidal ideations, or auditory hallucinations. His memory problems are stable at this time. He does have problems with ambulation lately and is off balance and unsteady.   He denies fevers, chills, fatigue, recent infections, weight loss, and night sweats. No reports of GI problems such as nausea, vomiting, diarrhea, and constipation. He has no reports of blood in stools, dysuria and hematuria. He denies pain today.   History reviewed. No pertinent surgical history.  Family History  Problem Relation Age of Onset  . CAD Neg Hx     Social History   Socioeconomic History  . Marital status: Married    Spouse name: Not on file  . Number of children: Not on file  . Years of education: Not on file   . Highest education level: Not on file  Occupational History  . Not on file  Social Needs  . Financial resource strain: Not on file  . Food insecurity    Worry: Not on file    Inability: Not on file  . Transportation needs    Medical: Not on file    Non-medical: Not on file  Tobacco Use  . Smoking status: Former Games developer  . Smokeless tobacco: Never Used  Substance and Sexual Activity  . Alcohol use: No  . Drug use: No  . Sexual activity: Not Currently  Lifestyle  . Physical activity    Days per week: Not on file    Minutes per session: Not on file  . Stress: Not on file  Relationships  . Social Musician on phone: Not on file    Gets together: Not on file    Attends religious service: Not on file    Active member of club or organization: Not on file    Attends meetings of clubs or organizations: Not on file    Relationship status: Not on file  . Intimate partner violence    Fear of current or ex partner: Not on file    Emotionally abused: Not on file    Physically abused: Not on file    Forced sexual activity: Not on file  Other Topics  Concern  . Not on file  Social History Narrative  . Not on file    Outpatient Medications Prior to Visit  Medication Sig Dispense Refill  . naproxen (NAPROSYN) 500 MG tablet Take 1 tablet (500 mg total) by mouth 2 (two) times daily with a meal. 60 tablet 3  . sertraline (ZOLOFT) 50 MG tablet Take 100 mg by mouth.     . traZODone (DESYREL) 50 MG tablet Take 0.5-1 tablets (25-50 mg total) by mouth at bedtime as needed for sleep. 30 tablet 3  . busPIRone (BUSPAR) 5 MG tablet Take 1 tablet (5 mg total) by mouth 3 (three) times daily. (Patient taking differently: Take 10 mg by mouth 3 (three) times daily. ) 60 tablet 1  . levothyroxine (SYNTHROID, LEVOTHROID) 50 MCG tablet Take 1 tablet (50 mcg total) by mouth daily before breakfast. 30 tablet 3  . Vitamin D, Ergocalciferol, (DRISDOL) 50000 units CAPS capsule Take 1 capsule  (50,000 Units total) by mouth every 7 (seven) days. 30 capsule 1  . lisinopril (PRINIVIL,ZESTRIL) 10 MG tablet Take 1 tablet (10 mg total) by mouth daily. (Patient not taking: Reported on 01/25/2019) 30 tablet 3   No facility-administered medications prior to visit.     No Known Allergies  ROS Review of Systems  Constitutional: Negative.   HENT: Negative.   Eyes: Negative.   Respiratory: Negative.   Cardiovascular: Negative.   Gastrointestinal: Negative.   Endocrine: Negative.   Genitourinary: Negative.   Musculoskeletal: Positive for arthralgias (chronic right extremity pain) and back pain (chronic).  Skin: Negative.   Allergic/Immunologic: Negative.   Neurological: Positive for dizziness (occasional ) and headaches (occasional ).  Hematological: Negative.   Psychiatric/Behavioral: Negative.    Objective:    Physical Exam  Constitutional: He is oriented to person, place, and time. He appears well-developed and well-nourished.  HENT:  Head: Normocephalic and atraumatic.  Eyes: Conjunctivae are normal.  Neck: Normal range of motion. Neck supple.  Cardiovascular: Normal rate, regular rhythm, normal heart sounds and intact distal pulses.  Pulmonary/Chest: Effort normal and breath sounds normal.  Abdominal: Soft. Bowel sounds are normal.  Musculoskeletal:     Comments: Limited ROM in right extremity and back  Neurological: He is alert and oriented to person, place, and time. He has normal reflexes.  Skin: Skin is warm and dry.  Psychiatric: He has a normal mood and affect. His behavior is normal. Judgment and thought content normal.  Nursing note and vitals reviewed.   BP (!) 152/99 (BP Location: Left Arm, Patient Position: Sitting, Cuff Size: Normal)   Pulse 77   Temp 99.4 F (37.4 C) (Oral)   Resp 14   Ht 5' (1.524 m)   Wt 128 lb (58.1 kg)   SpO2 100%   BMI 25.00 kg/m  Wt Readings from Last 3 Encounters:  01/25/19 128 lb (58.1 kg)  02/23/18 129 lb 12.8 oz (58.9  kg)  02/16/18 128 lb (58.1 kg)     Health Maintenance Due  Topic Date Due  . Hepatitis C Screening  Feb 18, 1956  . HIV Screening  12/05/1968  . TETANUS/TDAP  12/05/1972  . COLONOSCOPY  12/06/2003  . PNA vac Low Risk Adult (1 of 2 - PCV13) 12/06/2018  . INFLUENZA VACCINE  01/08/2019    There are no preventive care reminders to display for this patient.  Lab Results  Component Value Date   TSH 8.030 (H) 01/25/2019   Lab Results  Component Value Date   WBC 5.3 01/25/2019  HGB 14.1 01/25/2019   HCT 43.9 01/25/2019   MCV 88 01/25/2019   PLT 104 (L) 01/25/2019   Lab Results  Component Value Date   NA 142 01/25/2019   K 4.4 01/25/2019   CO2 22 01/25/2019   GLUCOSE 90 01/25/2019   BUN 10 01/25/2019   CREATININE 0.99 01/25/2019   BILITOT 0.5 01/25/2019   ALKPHOS 100 01/25/2019   AST 29 01/25/2019   ALT 37 01/25/2019   PROT 7.4 01/25/2019   ALBUMIN 4.3 01/25/2019   CALCIUM 9.1 01/25/2019   Lab Results  Component Value Date   CHOL 158 02/24/2014   Lab Results  Component Value Date   HDL 28 (L) 02/24/2014   Lab Results  Component Value Date   LDLCALC 102 (H) 02/24/2014   Lab Results  Component Value Date   TRIG 138 02/24/2014   Lab Results  Component Value Date   CHOLHDL 5.6 02/24/2014   Lab Results  Component Value Date   HGBA1C 5.3 01/25/2019   Assessment & Plan:   1. Muscle spasm of right shoulder - cyclobenzaprine (FLEXERIL) 10 MG tablet; Take 1 tablet (10 mg total) by mouth 2 (two) times daily as needed for up to 15 days for muscle spasms.  Dispense: 30 tablet; Refill: 0  2. Chronic right-sided low back pain with right-sided sciatica  3. Essential hypertension Blood pressure is elevated today, as patient is experiencing increased pain. He will continue medications as prescribed. He will continue to decrease high sodium intake, excessive alcohol intake, increase potassium intake, smoking cessation, and increase physical activity of at least 30  minutes of cardio activity daily. He will continue to follow Heart Healthy or DASH diet. - lisinopril (ZESTRIL) 10 MG tablet; Take 1 tablet (10 mg total) by mouth daily.  Dispense: 90 tablet; Refill: 1 - TSH - CBC with Differential - Comprehensive metabolic panel - Vitamin B12 - Vitamin D, 25-hydroxy - Urinalysis Dipstick - Hemoglobin A1c  4. Hypothyroidism, unspecified type - levothyroxine (SYNTHROID) 50 MCG tablet; Take 1 tablet (50 mcg total) by mouth daily before breakfast.  Dispense: 90 tablet; Refill: 1 - TSH - CBC with Differential - Comprehensive metabolic panel - Vitamin B12 - Vitamin D, 25-hydroxy - Urinalysis Dipstick - Hemoglobin A1c  5. Vitamin D deficiency - Vitamin D, Ergocalciferol, (DRISDOL) 1.25 MG (50000 UT) CAPS capsule; Take 1 capsule (50,000 Units total) by mouth every 7 (seven) days.  Dispense: 90 capsule; Refill: 1  6. Anxiety and depression We will increase Buspar to mg BID today.  - busPIRone (BUSPAR) 15 MG tablet; Take 1 tablet (15 mg total) by mouth 2 (two) times daily.  Dispense: 60 tablet; Refill: 3  7. Memory loss  8. Impaired mobility  9. Language barrier His son interprets for him today.   10. Cognitive impairment  11. Follow up He will follow up in 3 months.   Meds ordered this encounter  Medications  . levothyroxine (SYNTHROID) 50 MCG tablet    Sig: Take 1 tablet (50 mcg total) by mouth daily before breakfast.    Dispense:  90 tablet    Refill:  1  . Vitamin D, Ergocalciferol, (DRISDOL) 1.25 MG (50000 UT) CAPS capsule    Sig: Take 1 capsule (50,000 Units total) by mouth every 7 (seven) days.    Dispense:  90 capsule    Refill:  1  . lisinopril (ZESTRIL) 10 MG tablet    Sig: Take 1 tablet (10 mg total) by mouth daily.    Dispense:  90 tablet    Refill:  1  . busPIRone (BUSPAR) 15 MG tablet    Sig: Take 1 tablet (15 mg total) by mouth 2 (two) times daily.    Dispense:  60 tablet    Refill:  3  . cyclobenzaprine (FLEXERIL) 10 MG  tablet    Sig: Take 1 tablet (10 mg total) by mouth 2 (two) times daily as needed for up to 15 days for muscle spasms.    Dispense:  30 tablet    Refill:  0    Orders Placed This Encounter  Procedures  . TSH  . CBC with Differential  . Comprehensive metabolic panel  . Vitamin B12  . Vitamin D, 25-hydroxy  . Hemoglobin A1c  . Urinalysis Dipstick    Referral Orders  No referral(s) requested today    Raliegh IpNatalie Calina Patrie,  MSN, FNP-BC [facility] [address]  [place], [place] [phone]   Problem List Items Addressed This Visit      Endocrine   Hypothyroidism   Relevant Medications   levothyroxine (SYNTHROID) 50 MCG tablet   Other Relevant Orders   TSH (Completed)   CBC with Differential (Completed)   Comprehensive metabolic panel (Completed)   Vitamin B12 (Completed)   Vitamin D, 25-hydroxy (Completed)   Urinalysis Dipstick (Completed)   Hemoglobin A1c (Completed)     Other   Memory loss    Other Visit Diagnoses    Muscle spasm of right shoulder    -  Primary   Relevant Medications   cyclobenzaprine (FLEXERIL) 10 MG tablet   Chronic right-sided low back pain with right-sided sciatica       Relevant Medications   busPIRone (BUSPAR) 15 MG tablet   cyclobenzaprine (FLEXERIL) 10 MG tablet   Essential hypertension       Relevant Medications   lisinopril (ZESTRIL) 10 MG tablet   Other Relevant Orders   TSH (Completed)   CBC with Differential (Completed)   Comprehensive metabolic panel (Completed)   Vitamin B12 (Completed)   Vitamin D, 25-hydroxy (Completed)   Urinalysis Dipstick (Completed)   Hemoglobin A1c (Completed)   Vitamin D deficiency       Relevant Medications   Vitamin D, Ergocalciferol, (DRISDOL) 1.25 MG (50000 UT) CAPS capsule   Anxiety and depression       Relevant Medications   busPIRone (BUSPAR) 15 MG tablet   Impaired mobility       Language  barrier       Cognitive impairment       Follow up          Meds ordered this encounter  Medications  . levothyroxine (SYNTHROID) 50 MCG tablet    Sig: Take 1 tablet (50 mcg total) by mouth daily before breakfast.    Dispense:  90 tablet    Refill:  1  . Vitamin D, Ergocalciferol, (DRISDOL) 1.25 MG (50000 UT) CAPS capsule    Sig: Take 1 capsule (50,000 Units total) by mouth every 7 (seven) days.    Dispense:  90 capsule    Refill:  1  . lisinopril (ZESTRIL) 10 MG tablet    Sig: Take 1 tablet (10 mg total) by mouth daily.    Dispense:  90 tablet    Refill:  1  . busPIRone (BUSPAR) 15 MG tablet    Sig: Take 1 tablet (15 mg total) by mouth 2 (two) times daily.    Dispense:  60  tablet    Refill:  3  . cyclobenzaprine (FLEXERIL) 10 MG tablet    Sig: Take 1 tablet (10 mg total) by mouth 2 (two) times daily as needed for up to 15 days for muscle spasms.    Dispense:  30 tablet    Refill:  0    Follow-up: Return in about 3 months (around 04/27/2019).    Azzie Glatter, FNP

## 2019-01-25 NOTE — Patient Instructions (Signed)
Cyclobenzaprine tablets What is this medicine? CYCLOBENZAPRINE (sye kloe BEN za preen) is a muscle relaxer. It is used to treat muscle pain, spasms, and stiffness. This medicine may be used for other purposes; ask your health care provider or pharmacist if you have questions. COMMON BRAND NAME(S): Fexmid, Flexeril What should I tell my health care provider before I take this medicine? They need to know if you have any of these conditions:  heart disease, irregular heartbeat, or previous heart attack  liver disease  thyroid problem  an unusual or allergic reaction to cyclobenzaprine, tricyclic antidepressants, lactose, other medicines, foods, dyes, or preservatives  pregnant or trying to get pregnant  breast-feeding How should I use this medicine? Take this medicine by mouth with a glass of water. Follow the directions on the prescription label. If this medicine upsets your stomach, take it with food or milk. Take your medicine at regular intervals. Do not take it more often than directed. Talk to your pediatrician regarding the use of this medicine in children. Special care may be needed. Overdosage: If you think you have taken too much of this medicine contact a poison control center or emergency room at once. NOTE: This medicine is only for you. Do not share this medicine with others. What if I miss a dose? If you miss a dose, take it as soon as you can. If it is almost time for your next dose, take only that dose. Do not take double or extra doses. What may interact with this medicine? Do not take this medicine with any of the following medications:  MAOIs like Carbex, Eldepryl, Marplan, Nardil, and Parnate  narcotic medicines for cough  safinamide This medicine may also interact with the following medications:  alcohol  bupropion  antihistamines for allergy, cough and cold  certain medicines for anxiety or sleep  certain medicines for bladder problems like oxybutynin,  tolterodine  certain medicines for depression like amitriptyline, fluoxetine, sertraline  certain medicines for Parkinson's disease like benztropine, trihexyphenidyl  certain medicines for seizures like phenobarbital, primidone  certain medicines for stomach problems like dicyclomine, hyoscyamine  certain medicines for travel sickness like scopolamine  general anesthetics like halothane, isoflurane, methoxyflurane, propofol  ipratropium  local anesthetics like lidocaine, pramoxine, tetracaine  medicines that relax muscles for surgery  narcotic medicines for pain  phenothiazines like chlorpromazine, mesoridazine, prochlorperazine, thioridazine  verapamil This list may not describe all possible interactions. Give your health care provider a list of all the medicines, herbs, non-prescription drugs, or dietary supplements you use. Also tell them if you smoke, drink alcohol, or use illegal drugs. Some items may interact with your medicine. What should I watch for while using this medicine? Tell your doctor or health care professional if your symptoms do not start to get better or if they get worse. You may get drowsy or dizzy. Do not drive, use machinery, or do anything that needs mental alertness until you know how this medicine affects you. Do not stand or sit up quickly, especially if you are an older patient. This reduces the risk of dizzy or fainting spells. Alcohol may interfere with the effect of this medicine. Avoid alcoholic drinks. If you are taking another medicine that also causes drowsiness, you may have more side effects. Give your health care provider a list of all medicines you use. Your doctor will tell you how much medicine to take. Do not take more medicine than directed. Call emergency for help if you have problems breathing or unusual sleepiness.  Your mouth may get dry. Chewing sugarless gum or sucking hard candy, and drinking plenty of water may help. Contact your  doctor if the problem does not go away or is severe. What side effects may I notice from receiving this medicine? Side effects that you should report to your doctor or health care professional as soon as possible:  allergic reactions like skin rash, itching or hives, swelling of the face, lips, or tongue  breathing problems  chest pain  fast, irregular heartbeat  hallucinations  seizures  unusually weak or tired Side effects that usually do not require medical attention (report to your doctor or health care professional if they continue or are bothersome):  headache  nausea, vomiting This list may not describe all possible side effects. Call your doctor for medical advice about side effects. You may report side effects to FDA at 1-800-FDA-1088. Where should I keep my medicine? Keep out of the reach of children. Store at room temperature between 15 and 30 degrees C (59 and 86 degrees F). Keep container tightly closed. Throw away any unused medicine after the expiration date. NOTE: This sheet is a summary. It may not cover all possible information. If you have questions about this medicine, talk to your doctor, pharmacist, or health care provider.  2020 Elsevier/Gold Standard (2018-04-28 12:49:26) Muscle Cramps and Spasms Muscle cramps and spasms are when muscles tighten by themselves. They usually get better within minutes. Muscle cramps are painful. They are usually stronger and last longer than muscle spasms. Muscle spasms may or may not be painful. They can last a few seconds or much longer. Cramps and spasms can affect any muscle, but they occur most often in the calf muscles of the leg. They are usually not caused by a serious problem. In many cases, the cause is not known. Some common causes include:  Doing more physical work or exercise than your body is ready for.  Using the muscles too much (overuse) by repeating certain movements too many times.  Staying in a certain  position for a long time.  Playing a sport or doing an activity without preparing properly.  Using bad form or technique while playing a sport or doing an activity.  Not having enough water in your body (dehydration).  Injury.  Side effects of some medicines.  Low levels of the salts and minerals in your blood (electrolytes), such as low potassium or calcium. Follow these instructions at home: Managing pain and stiffness      Massage, stretch, and relax the muscle. Do this for many minutes at a time.  If told, put heat on tight or tense muscles as often as told by your doctor. Use the heat source that your doctor recommends, such as a moist heat pack or a heating pad. ? Place a towel between your skin and the heat source. ? Leave the heat on for 20-30 minutes. ? Remove the heat if your skin turns bright red. This is very important if you are not able to feel pain, heat, or cold. You may have a greater risk of getting burned.  If told, put ice on the affected area. This may help if you are sore or have pain after a cramp or spasm. ? Put ice in a plastic bag. ? Place a towel between your skin and the bag. ? Leave the ice on for 20 minutes, 2-3 times a day.  Try taking hot showers or baths to help relax tight muscles. Eating and drinking    Drink enough fluid to keep your pee (urine) pale yellow.  Eat a healthy diet to help ensure that your muscles work well. This should include: ? Fruits and vegetables. ? Lean protein. ? Whole grains. ? Low-fat or nonfat dairy products. General instructions  If you are having cramps often, avoid intense exercise for several days.  Take over-the-counter and prescription medicines only as told by your doctor.  Watch for any changes in your symptoms.  Keep all follow-up visits as told by your doctor. This is important. Contact a doctor if:  Your cramps or spasms get worse or happen more often.  Your cramps or spasms do not get better  with time. Summary  Muscle cramps and spasms are when muscles tighten by themselves. They usually get better within minutes.  Cramps and spasms occur most often in the calf muscles of the leg.  Massage, stretch, and relax the muscle. This may help the cramp or spasm go away.  Drink enough fluid to keep your pee (urine) pale yellow. This information is not intended to replace advice given to you by your health care provider. Make sure you discuss any questions you have with your health care provider. Document Released: 05/08/2008 Document Revised: 10/19/2017 Document Reviewed: 10/19/2017 Elsevier Patient Education  2020 [employer].

## 2019-01-26 LAB — COMPREHENSIVE METABOLIC PANEL
ALT: 37 IU/L (ref 0–44)
AST: 29 IU/L (ref 0–40)
Albumin/Globulin Ratio: 1.4 (ref 1.2–2.2)
Albumin: 4.3 g/dL (ref 3.8–4.8)
Alkaline Phosphatase: 100 IU/L (ref 39–117)
BUN/Creatinine Ratio: 10 (ref 10–24)
BUN: 10 mg/dL (ref 8–27)
Bilirubin Total: 0.5 mg/dL (ref 0.0–1.2)
CO2: 22 mmol/L (ref 20–29)
Calcium: 9.1 mg/dL (ref 8.6–10.2)
Chloride: 106 mmol/L (ref 96–106)
Creatinine, Ser: 0.99 mg/dL (ref 0.76–1.27)
GFR calc Af Amer: 92 mL/min/{1.73_m2} (ref >59)
GFR calc non Af Amer: 80 mL/min/{1.73_m2} (ref >59)
Globulin, Total: 3.1 g/dL (ref 1.5–4.5)
Glucose: 90 mg/dL (ref 65–99)
Potassium: 4.4 mmol/L (ref 3.5–5.2)
Sodium: 142 mmol/L (ref 134–144)
Total Protein: 7.4 g/dL (ref 6.0–8.5)

## 2019-01-26 LAB — CBC WITH DIFFERENTIAL/PLATELET
Basophils Absolute: 0 10*3/uL (ref 0.0–0.2)
Basos: 0 %
EOS (ABSOLUTE): 0.2 10*3/uL (ref 0.0–0.4)
Eos: 3 %
Hematocrit: 43.9 % (ref 37.5–51.0)
Hemoglobin: 14.1 g/dL (ref 13.0–17.7)
Immature Grans (Abs): 0 10*3/uL (ref 0.0–0.1)
Immature Granulocytes: 0 %
Lymphocytes Absolute: 1.2 10*3/uL (ref 0.7–3.1)
Lymphs: 23 %
MCH: 28.4 pg (ref 26.6–33.0)
MCHC: 32.1 g/dL (ref 31.5–35.7)
MCV: 88 fL (ref 79–97)
Monocytes Absolute: 0.4 10*3/uL (ref 0.1–0.9)
Monocytes: 7 %
Neutrophils Absolute: 3.5 10*3/uL (ref 1.4–7.0)
Neutrophils: 67 %
Platelets: 104 10*3/uL — ABNORMAL LOW (ref 150–450)
RBC: 4.97 x10E6/uL (ref 4.14–5.80)
RDW: 13 % (ref 11.6–15.4)
WBC: 5.3 10*3/uL (ref 3.4–10.8)

## 2019-01-26 LAB — VITAMIN B12: Vitamin B-12: 445 pg/mL (ref 232–1245)

## 2019-01-26 LAB — HEMOGLOBIN A1C
Est. average glucose Bld gHb Est-mCnc: 105 mg/dL
Hgb A1c MFr Bld: 5.3 % (ref 4.8–5.6)

## 2019-01-26 LAB — TSH: TSH: 8.03 u[IU]/mL — ABNORMAL HIGH (ref 0.450–4.500)

## 2019-01-26 LAB — VITAMIN D 25 HYDROXY (VIT D DEFICIENCY, FRACTURES): Vit D, 25-Hydroxy: 67.6 ng/mL (ref 30.0–100.0)

## 2019-01-29 DIAGNOSIS — Z758 Other problems related to medical facilities and other health care: Secondary | ICD-10-CM | POA: Insufficient documentation

## 2019-01-29 DIAGNOSIS — R4189 Other symptoms and signs involving cognitive functions and awareness: Secondary | ICD-10-CM | POA: Insufficient documentation

## 2019-01-29 DIAGNOSIS — M5441 Lumbago with sciatica, right side: Secondary | ICD-10-CM | POA: Insufficient documentation

## 2019-01-29 DIAGNOSIS — F419 Anxiety disorder, unspecified: Secondary | ICD-10-CM | POA: Insufficient documentation

## 2019-01-29 DIAGNOSIS — E559 Vitamin D deficiency, unspecified: Secondary | ICD-10-CM | POA: Insufficient documentation

## 2019-01-29 DIAGNOSIS — Z789 Other specified health status: Secondary | ICD-10-CM | POA: Insufficient documentation

## 2019-01-29 DIAGNOSIS — F329 Major depressive disorder, single episode, unspecified: Secondary | ICD-10-CM | POA: Insufficient documentation

## 2019-01-29 DIAGNOSIS — Z7409 Other reduced mobility: Secondary | ICD-10-CM | POA: Insufficient documentation

## 2019-01-29 DIAGNOSIS — M62838 Other muscle spasm: Secondary | ICD-10-CM | POA: Insufficient documentation

## 2019-01-29 DIAGNOSIS — G8929 Other chronic pain: Secondary | ICD-10-CM | POA: Insufficient documentation

## 2019-01-29 DIAGNOSIS — I10 Essential (primary) hypertension: Secondary | ICD-10-CM | POA: Insufficient documentation

## 2019-01-29 MED ORDER — CYCLOBENZAPRINE HCL 10 MG PO TABS: 10.0000 mg | ORAL_TABLET | Freq: Two times a day (BID) | ORAL | 3 refills | Status: AC | PRN

## 2019-02-05 ENCOUNTER — Other Ambulatory Visit: Payer: Self-pay | Admitting: Family Medicine

## 2019-02-05 DIAGNOSIS — E039 Hypothyroidism, unspecified: Secondary | ICD-10-CM

## 2019-02-10 ENCOUNTER — Telehealth: Payer: Self-pay

## 2019-02-10 NOTE — Telephone Encounter (Signed)
Called pt number is wrong.

## 2019-02-10 NOTE — Telephone Encounter (Signed)
-----   Message from Azzie Glatter, [place] sent at 02/05/2019  7:10 AM EDT ----- Regarding: "Interpreter" Correction!! Ms. Revoir needs 'Orient' interpreter, not Hispanic Interpreter. Thanks!

## 2019-04-27 ENCOUNTER — Ambulatory Visit: Payer: Medicaid Other | Admitting: Family Medicine

## 2019-05-10 DIAGNOSIS — F4312 Post-traumatic stress disorder, chronic: Secondary | ICD-10-CM | POA: Insufficient documentation

## 2019-07-21 ENCOUNTER — Other Ambulatory Visit: Payer: Self-pay | Admitting: Family Medicine

## 2019-07-21 DIAGNOSIS — I10 Essential (primary) hypertension: Secondary | ICD-10-CM

## 2019-07-21 DIAGNOSIS — E039 Hypothyroidism, unspecified: Secondary | ICD-10-CM

## 2019-07-21 NOTE — Telephone Encounter (Signed)
Needs apt for more refills.

## 2019-08-09 ENCOUNTER — Telehealth: Payer: Self-pay | Admitting: Family Medicine

## 2019-08-09 NOTE — Telephone Encounter (Signed)
Called pt to remind them off appointment. No answer. Left a message.

## 2019-08-10 ENCOUNTER — Telehealth (INDEPENDENT_AMBULATORY_CARE_PROVIDER_SITE_OTHER): Payer: Medicare Other | Admitting: Family Medicine

## 2019-08-10 DIAGNOSIS — M5441 Lumbago with sciatica, right side: Secondary | ICD-10-CM

## 2019-08-10 DIAGNOSIS — E039 Hypothyroidism, unspecified: Secondary | ICD-10-CM

## 2019-08-10 DIAGNOSIS — Z09 Encounter for follow-up examination after completed treatment for conditions other than malignant neoplasm: Secondary | ICD-10-CM

## 2019-08-10 DIAGNOSIS — M62838 Other muscle spasm: Secondary | ICD-10-CM | POA: Diagnosis not present

## 2019-08-10 DIAGNOSIS — F419 Anxiety disorder, unspecified: Secondary | ICD-10-CM

## 2019-08-10 DIAGNOSIS — I10 Essential (primary) hypertension: Secondary | ICD-10-CM

## 2019-08-10 DIAGNOSIS — F329 Major depressive disorder, single episode, unspecified: Secondary | ICD-10-CM

## 2019-08-10 DIAGNOSIS — F32A Depression, unspecified: Secondary | ICD-10-CM

## 2019-08-10 DIAGNOSIS — G8929 Other chronic pain: Secondary | ICD-10-CM

## 2019-08-10 MED ORDER — LEVOTHYROXINE SODIUM 50 MCG PO TABS: 50.0000 ug | ORAL_TABLET | Freq: Every day | ORAL | 6 refills | Status: DC

## 2019-08-10 MED ORDER — LISINOPRIL 10 MG PO TABS: 10.0000 mg | ORAL_TABLET | Freq: Every day | ORAL | 6 refills | Status: DC

## 2019-08-10 NOTE — Progress Notes (Signed)
Virtual Visit via Telephone Note  I connected with Shawn Casey on 08/10/19 at 11:00 AM EST by telephone and verified that I am speaking with the correct person using two identifiers.   I discussed the limitations, risks, security and privacy concerns of performing an evaluation and management service by telephone and the availability of in person appointments. I also discussed with the patient that there may be a patient responsible charge related to this service. The patient expressed understanding and agreed to proceed.   History of Present Illness:  No past surgical history on file.   Social History   Socioeconomic History  . Marital status: Married    Spouse name: Not on file  . Number of children: Not on file  . Years of education: Not on file  . Highest education level: Not on file  Occupational History  . Not on file  Tobacco Use  . Smoking status: Former Games developer  . Smokeless tobacco: Never Used  Substance and Sexual Activity  . Alcohol use: No  . Drug use: No  . Sexual activity: Not Currently  Other Topics Concern  . Not on file  Social History Narrative  . Not on file   Social Determinants of Health   Financial Resource Strain:   . Difficulty of Paying Living Expenses: Not on file  Food Insecurity:   . Worried About Programme researcher, broadcasting/film/video in the Last Year: Not on file  . Ran Out of Food in the Last Year: Not on file  Transportation Needs:   . Lack of Transportation (Medical): Not on file  . Lack of Transportation (Non-Medical): Not on file  Physical Activity:   . Days of Exercise per Week: Not on file  . Minutes of Exercise per Session: Not on file  Stress:   . Feeling of Stress : Not on file  Social Connections:   . Frequency of Communication with Friends and Family: Not on file  . Frequency of Social Gatherings with Friends and Family: Not on file  . Attends Religious Services: Not on file  . Active Member of Clubs or Organizations: Not on file   . Attends Banker Meetings: Not on file  . Marital Status: Not on file  Intimate Partner Violence:   . Fear of Current or Ex-Partner: Not on file  . Emotionally Abused: Not on file  . Physically Abused: Not on file  . Sexually Abused: Not on file    Family History  Problem Relation Age of Onset  . CAD Neg Hx     Past Medical History:  Diagnosis Date  . Anxiety and depression   . Chronic low back pain with right-sided sciatica   . Hypertension   . Hypothyroidism   . Insomnia   . Memory loss     No Known Allergies   Past Medical History:  Diagnosis Date  . Anxiety and depression   . Chronic low back pain with right-sided sciatica   . Hypertension   . Hypothyroidism   . Insomnia   . Memory loss     Current Status: Since his last office visit, he is doing well with no complaints. Continues to have chronic right shoulder pain, which he takes Acetaminophen as needed. He denies visual changes, chest pain, cough, shortness of breath, heart palpitations, and falls. He has occasional headaches and dizziness with position changes. Denies severe headaches, confusion, seizures, double vision, and blurred vision, nausea and vomiting. His anxiety is stable today. He denies suicidal  ideations, homicidal ideations, or auditory hallucinations. He denies fevers, chills, fatigue, recent infections, weight loss, and night sweats. No reports of GI problems such as nausea, vomiting, diarrhea, and constipation. He has no reports of blood in stools, dysuria and hematuria.  Observations/Objective:  MyChart Virtual Visit   Assessment and Plan:  1. Chronic right-sided low back pain with right-sided sciatica She will continue pain medications as needed.   2. Muscle spasm of right shoulder Stable today.  3. Essential hypertension He will continue to take medications as prescribed, to decrease high sodium intake, excessive alcohol intake, increase potassium intake, smoking  cessation, and increase physical activity of at least 30 minutes of cardio activity daily. He will continue to follow Heart Healthy or DASH diet. - lisinopril (ZESTRIL) 10 MG tablet; Take 1 tablet (10 mg total) by mouth daily.  Dispense: 30 tablet; Refill: 6  4. Hypothyroidism, unspecified type - levothyroxine (SYNTHROID) 50 MCG tablet; Take 1 tablet (50 mcg total) by mouth daily before breakfast.  Dispense: 30 tablet; Refill: 6  5. Anxiety and depression Stable today.   Follow Up Instructions:  He will follow up in 6 months.   Meds ordered this encounter  Medications  . levothyroxine (SYNTHROID) 50 MCG tablet    Sig: Take 1 tablet (50 mcg total) by mouth daily before breakfast.    Dispense:  30 tablet    Refill:  6  . lisinopril (ZESTRIL) 10 MG tablet    Sig: Take 1 tablet (10 mg total) by mouth daily.    Dispense:  30 tablet    Refill:  6    No orders of the defined types were placed in this encounter.   Referral Orders  No referral(s) requested today   Kathe Becton,  MSN, FNP-BC [facility] [address] [place], [place] [address] [phone]- fax     I discussed the assessment and treatment plan with the patient. The patient was provided an opportunity to ask questions and all were answered. The patient agreed with the plan and demonstrated an understanding of the instructions.   The patient was advised to call back or seek an in-person evaluation if the symptoms worsen or if the condition fails to improve as anticipated.  I provided 20 minutes of non-face-to-face time during this encounter.   Azzie Glatter, FNP

## 2019-08-15 NOTE — Addendum Note (Signed)
Addended by: Kallie Locks on: 08/15/2019 12:36 AM   Modules accepted: Level of Service

## 2019-09-27 IMAGING — CR DG LUMBAR SPINE COMPLETE 4+V
5 series · 5 of 5 positions shown · non-contrast
Comparison: None.

CLINICAL DATA: Chronic low back pain without known injury.

EXAM:
LUMBAR SPINE - COMPLETE 4+ VIEW

[t l-spine a.p.]
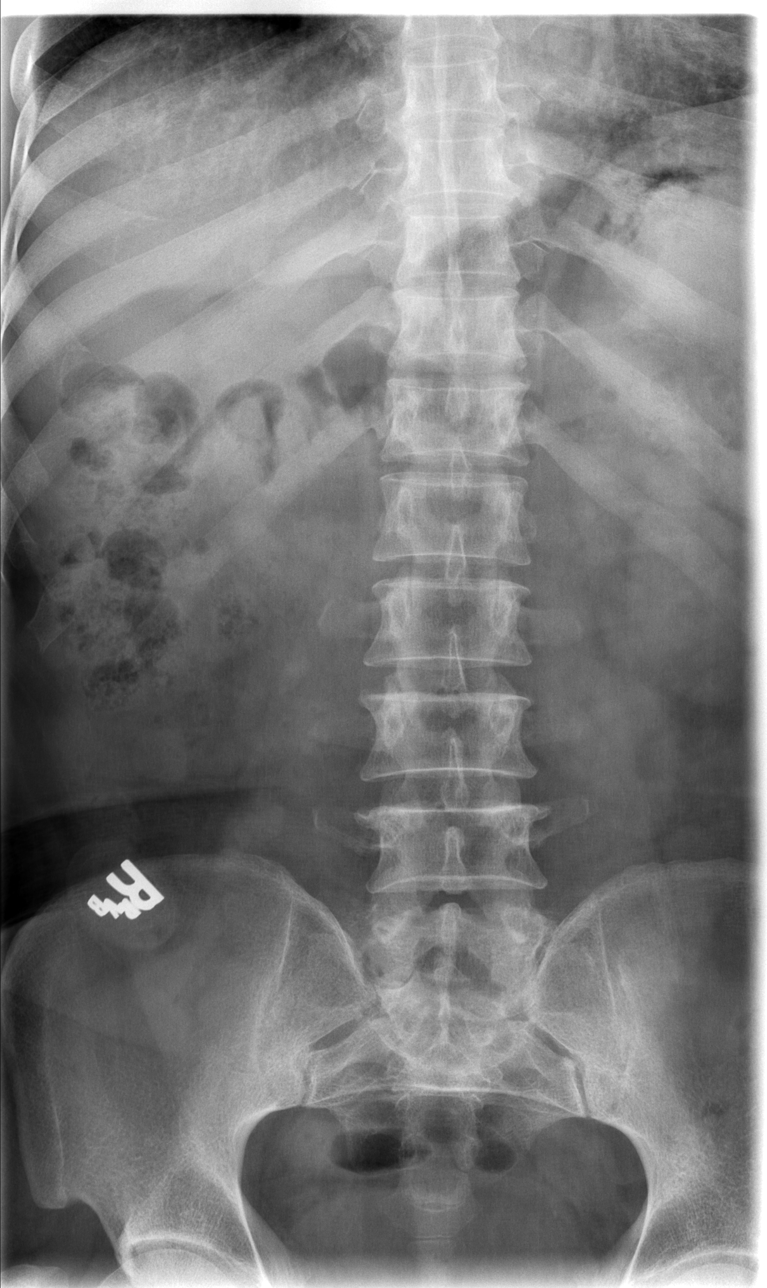

[t l-spine oblique exposure (1 of 2)]
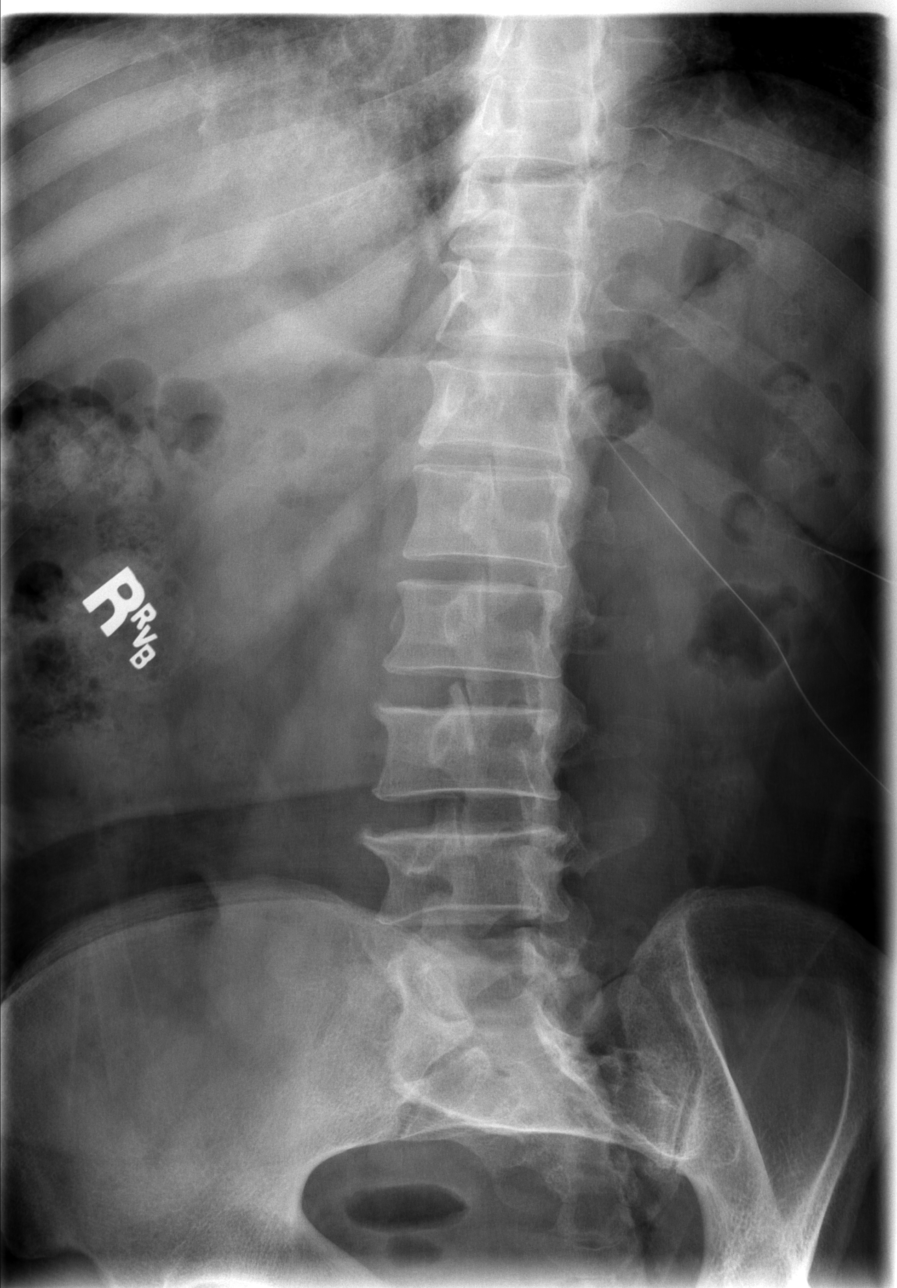

[t l-spine oblique exposure (2 of 2)]
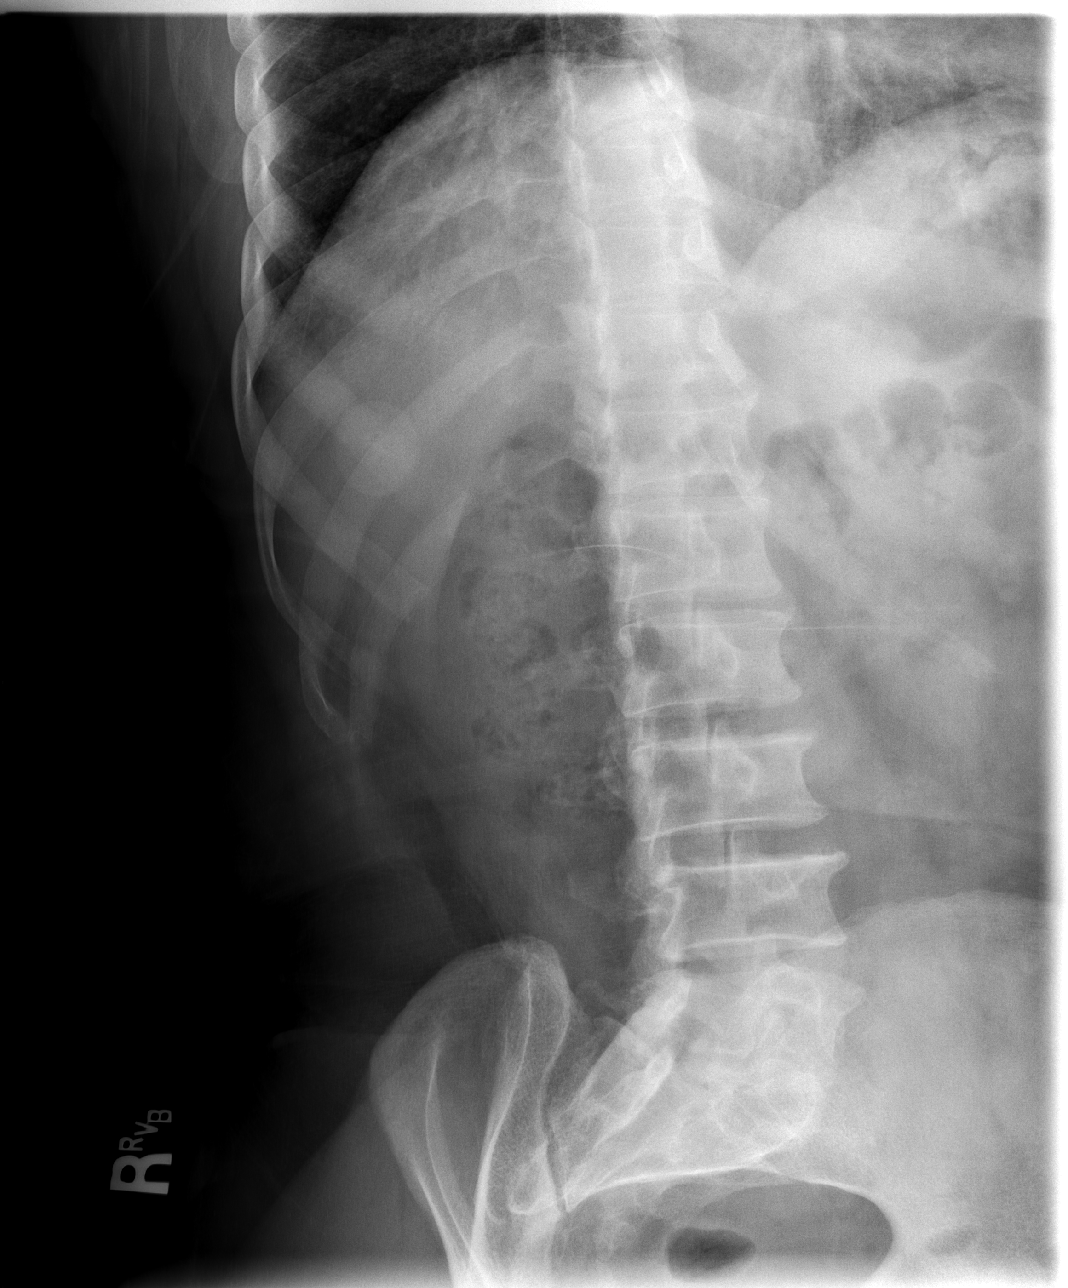

[t l-spine lat]
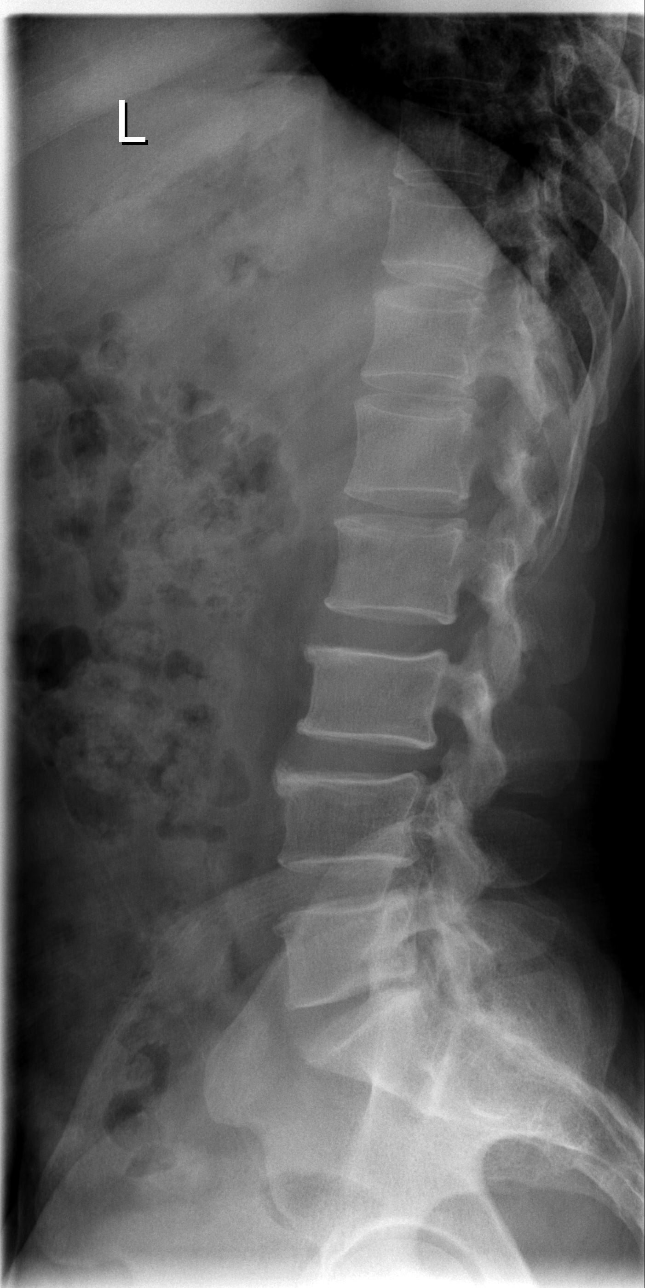

[t l-spine l5-s1 spot]
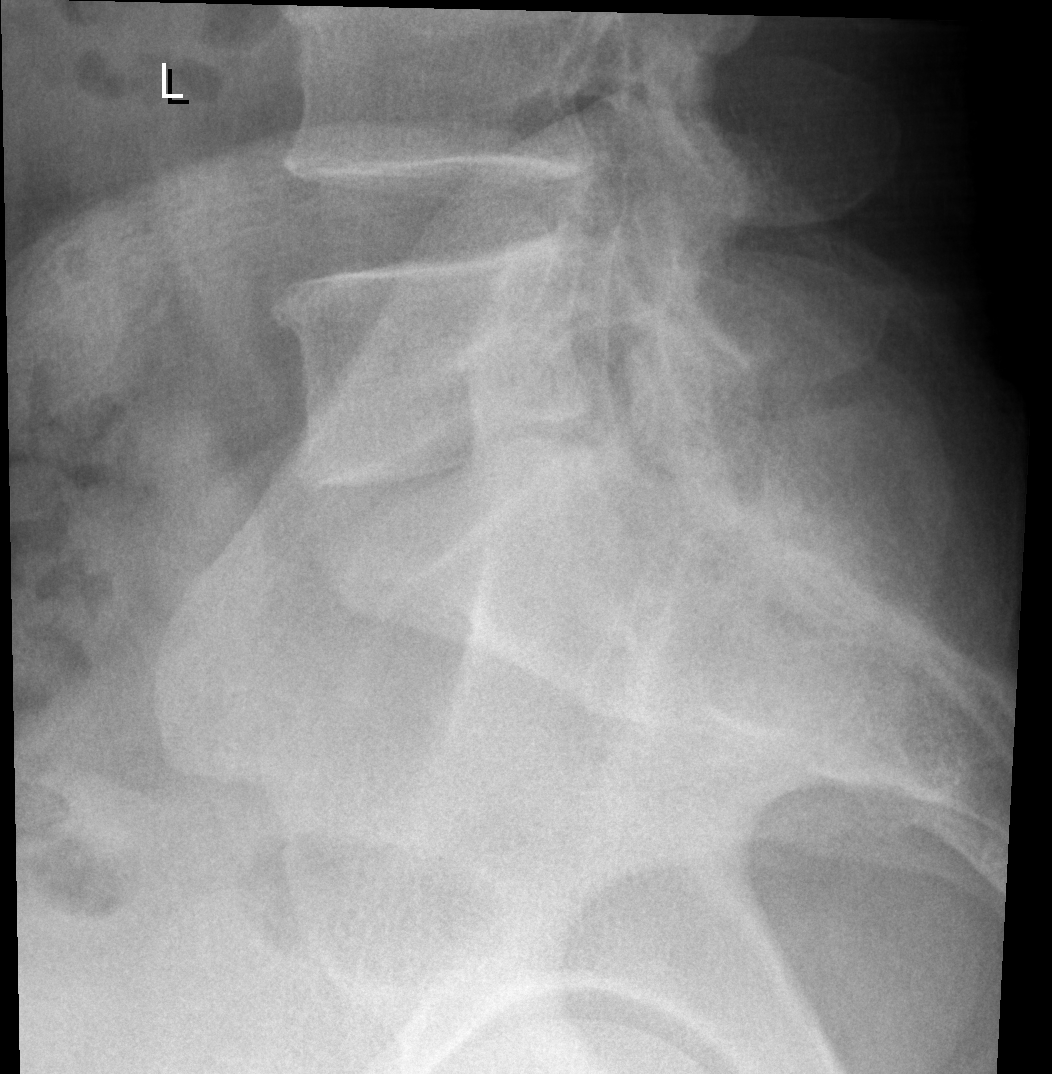

[5 of 5 positions shown; findings below may reference images not displayed]

FINDINGS: No fracture or spondylolisthesis is noted. Mild anterior osteophyte
formation is noted at L2-3, L3-4 and L4-5. Disc spaces are
well-maintained. Posterior facet joints are unremarkable.
IMPRESSION: Mild degenerative changes as described above. No acute abnormality
seen in the lumbar spine.

## 2020-02-08 DIAGNOSIS — E785 Hyperlipidemia, unspecified: Secondary | ICD-10-CM

## 2020-02-08 DIAGNOSIS — D696 Thrombocytopenia, unspecified: Secondary | ICD-10-CM

## 2020-02-08 HISTORY — DX: Thrombocytopenia, unspecified: D69.6

## 2020-02-08 HISTORY — DX: Hyperlipidemia, unspecified: E78.5

## 2020-02-10 ENCOUNTER — Other Ambulatory Visit: Payer: Self-pay

## 2020-02-10 ENCOUNTER — Encounter: Payer: Self-pay | Admitting: Family Medicine

## 2020-02-10 ENCOUNTER — Ambulatory Visit (INDEPENDENT_AMBULATORY_CARE_PROVIDER_SITE_OTHER): Payer: Medicare Other | Admitting: Family Medicine

## 2020-02-10 VITALS — BP 143/77 | HR 60 | Temp 97.7°F | Resp 20 | Ht 60.0 in | Wt 128.0 lb

## 2020-02-10 DIAGNOSIS — I10 Essential (primary) hypertension: Secondary | ICD-10-CM

## 2020-02-10 DIAGNOSIS — E039 Hypothyroidism, unspecified: Secondary | ICD-10-CM

## 2020-02-10 DIAGNOSIS — M5441 Lumbago with sciatica, right side: Secondary | ICD-10-CM | POA: Diagnosis not present

## 2020-02-10 DIAGNOSIS — M62838 Other muscle spasm: Secondary | ICD-10-CM

## 2020-02-10 DIAGNOSIS — G8929 Other chronic pain: Secondary | ICD-10-CM

## 2020-02-10 DIAGNOSIS — Z09 Encounter for follow-up examination after completed treatment for conditions other than malignant neoplasm: Secondary | ICD-10-CM

## 2020-02-10 DIAGNOSIS — E559 Vitamin D deficiency, unspecified: Secondary | ICD-10-CM

## 2020-02-10 DIAGNOSIS — R739 Hyperglycemia, unspecified: Secondary | ICD-10-CM | POA: Diagnosis not present

## 2020-02-10 DIAGNOSIS — E538 Deficiency of other specified B group vitamins: Secondary | ICD-10-CM

## 2020-02-10 DIAGNOSIS — F419 Anxiety disorder, unspecified: Secondary | ICD-10-CM

## 2020-02-10 DIAGNOSIS — G4700 Insomnia, unspecified: Secondary | ICD-10-CM

## 2020-02-10 DIAGNOSIS — F329 Major depressive disorder, single episode, unspecified: Secondary | ICD-10-CM

## 2020-02-10 DIAGNOSIS — R350 Frequency of micturition: Secondary | ICD-10-CM

## 2020-02-10 LAB — POCT URINALYSIS DIPSTICK
Bilirubin, UA: NEGATIVE
Blood, UA: NEGATIVE
Glucose, UA: NEGATIVE
Ketones, UA: NEGATIVE
Leukocytes, UA: NEGATIVE
Nitrite, UA: NEGATIVE
Protein, UA: NEGATIVE
Spec Grav, UA: 1.02 (ref 1.010–1.025)
Urobilinogen, UA: 0.2 E.U./dL
pH, UA: 5.5 (ref 5.0–8.0)

## 2020-02-10 NOTE — Progress Notes (Signed)
[facility]   Established Patient Office Visit  Subjective:  Patient ID: Shawn Casey, male    DOB: 1955/06/20  Age: 64 y.o. MRN: [id number]  CC: No chief complaint on file.   HPI Shawn Casey is a 64 year old male who presents for Follow Up today.   Patient Active Problem List   Diagnosis Date Noted  . Muscle spasm of right shoulder 01/29/2019  . Chronic right-sided low back pain with right-sided sciatica 01/29/2019  . Essential hypertension 01/29/2019  . Vitamin D deficiency 01/29/2019  . Anxiety and depression 01/29/2019  . Impaired mobility 01/29/2019  . Language barrier 01/29/2019  . Cognitive impairment 01/29/2019  . PTSD (post-traumatic stress disorder) 02/23/2018  . Memory loss   . Hypothyroidism   . Chest pain 02/23/2014  . Urticaria 02/23/2014    Past Medical History:  Diagnosis Date  . Anxiety and depression   . Chronic low back pain with right-sided sciatica   . Hyperglycemia   . Hyperlipidemia 02/2020  . Hypertension   . Hypothyroidism   . Insomnia   . Memory loss   . Thrombocytopenia (HCC) 02/2020    No past surgical history on file.  Family History  Problem Relation Age of Onset  . CAD Neg Hx     Current Status: Since her last office visit, she is doing well with no complaints. She continues to have c/o chronic right shoulder pain. She is currently taking OTC pain medications and Rx for Naproxen PRN for moderate relief of pain. She denies fevers, chills, fatigue, recent infections, weight loss, and night sweats. She has not had any headaches, visual changes, dizziness, and falls. No chest pain, heart palpitations, cough and shortness of breath reported. Denies GI problems such as nausea, vomiting, diarrhea, and constipation. She has no reports of blood in stools, dysuria and hematuria. No depression or anxiety reported today. She is taking all medications as prescribed.    Social History   Socioeconomic History  . Marital status: Married    Spouse name: Not on file  . Number of children: Not on file  . Years of education: Not on file  . Highest education level: Not on file  Occupational History  . Not on file  Tobacco Use  . Smoking status: Former Games developermoker  . Smokeless tobacco: Never Used  Substance and Sexual Activity  . Alcohol use: No  . Drug use: No  . Sexual activity: Not Currently  Other Topics Concern  . Not on file  Social History Narrative  . Not on file   Social Determinants of Health   Financial Resource Strain:   . Difficulty of Paying Living Expenses: Not on file  Food Insecurity:   . Worried About Programme researcher, broadcasting/film/videounning Out of Food in the Last Year: Not on file  . Ran Out of Food in the Last Year: Not on file  Transportation Needs:   . Lack of Transportation (Medical): Not on file  . Lack of Transportation (Non-Medical): Not on file  Physical Activity:   . Days of Exercise per Week: Not on file  . Minutes of Exercise per Session: Not on file  Stress:   . Feeling of Stress : Not on file  Social Connections:   . Frequency of Communication with Friends and Family: Not on file  . Frequency of Social Gatherings with Friends and Family: Not on file  . Attends Religious Services: Not on file  . Active Member of Clubs  or Organizations: Not on file  . Attends Banker Meetings: Not on file  . Marital Status: Not on file  Intimate Partner Violence:   . Fear of Current or Ex-Partner: Not on file  . Emotionally Abused: Not on file  . Physically Abused: Not on file  . Sexually Abused: Not on file    Outpatient Medications Prior to Visit  Medication Sig Dispense Refill  . Vitamin D, Ergocalciferol, (DRISDOL) 1.25 MG (50000 UT) CAPS capsule Take 1 capsule (50,000 Units total) by mouth every 7 (seven) days. 90 capsule 1  . busPIRone (BUSPAR) 15 MG tablet Take 1 tablet (15 mg total) by mouth 2 (two) times daily. 60 tablet 3  .  levothyroxine (SYNTHROID) 50 MCG tablet Take 1 tablet (50 mcg total) by mouth daily before breakfast. 30 tablet 6  . lisinopril (ZESTRIL) 10 MG tablet Take 1 tablet (10 mg total) by mouth daily. 30 tablet 6  . naproxen (NAPROSYN) 500 MG tablet Take 1 tablet (500 mg total) by mouth 2 (two) times daily with a meal. 60 tablet 3  . sertraline (ZOLOFT) 50 MG tablet Take 100 mg by mouth.     . traZODone (DESYREL) 50 MG tablet Take 0.5-1 tablets (25-50 mg total) by mouth at bedtime as needed for sleep. 30 tablet 3   No facility-administered medications prior to visit.    No Known Allergies  ROS Review of Systems  Constitutional: Negative.   HENT: Negative.   Eyes: Negative.   Respiratory: Negative.   Cardiovascular: Negative.   Gastrointestinal: Negative.   Endocrine: Negative.   Genitourinary: Negative.   Musculoskeletal: Positive for arthralgias (generalized joint pain; chronic right shoulder pain) and back pain (chronic).  Skin: Negative.   Allergic/Immunologic: Negative.   Neurological: Positive for dizziness (occasional) and headaches (occasional ).  Hematological: Negative.   Psychiatric/Behavioral: Negative.       Objective:    Physical Exam Vitals and nursing note reviewed.  Constitutional:      Appearance: Normal appearance.  HENT:     Head: Normocephalic and atraumatic.     Nose: Nose normal.     Mouth/Throat:     Mouth: Mucous membranes are dry.     Pharynx: Oropharynx is clear.  Cardiovascular:     Rate and Rhythm: Normal rate and regular rhythm.     Pulses: Normal pulses.     Heart sounds: Normal heart sounds.  Pulmonary:     Effort: Pulmonary effort is normal.     Breath sounds: Normal breath sounds.  Abdominal:     General: Bowel sounds are normal.     Palpations: Abdomen is soft.  Musculoskeletal:        General: Normal range of motion.     Cervical back: Normal range of motion and neck supple.  Skin:    General: Skin is warm and dry.  Neurological:       General: No focal deficit present.     Mental Status: He is alert and oriented to person, place, and time.  Psychiatric:        Mood and Affect: Mood normal.        Behavior: Behavior normal.        Thought Content: Thought content normal.        Judgment: Judgment normal.     BP (!) 143/77   Pulse 60   Temp 97.7 F (36.5 C)   Resp 20   SpO2 99%  Wt Readings from Last 3 Encounters:  01/25/19  128 lb (58.1 kg)  02/23/18 129 lb 12.8 oz (58.9 kg)  02/16/18 128 lb (58.1 kg)     Health Maintenance Due  Topic Date Due  . Hepatitis C Screening  Never done  . COVID-19 Vaccine (1) Never done  . TETANUS/TDAP  Never done  . COLONOSCOPY  Never done  . PNA vac Low Risk Adult (1 of 2 - PCV13) Never done  . INFLUENZA VACCINE  Never done    There are no preventive care reminders to display for this patient.  Lab Results  Component Value Date   TSH 2.080 02/10/2020   Lab Results  Component Value Date   WBC 5.1 02/10/2020   HGB 14.6 02/10/2020   HCT 44.6 02/10/2020   MCV 86 02/10/2020   PLT 93 (LL) 02/10/2020   Lab Results  Component Value Date   NA 141 02/10/2020   K 4.6 02/10/2020   CO2 22 02/10/2020   GLUCOSE 77 02/10/2020   BUN 10 02/10/2020   CREATININE 1.04 02/10/2020   BILITOT 0.5 02/10/2020   ALKPHOS 110 02/10/2020   AST 30 02/10/2020   ALT 32 02/10/2020   PROT 7.4 02/10/2020   ALBUMIN 4.5 02/10/2020   CALCIUM 9.0 02/10/2020   Lab Results  Component Value Date   CHOL 172 02/10/2020   Lab Results  Component Value Date   HDL 26 (L) 02/10/2020   Lab Results  Component Value Date   LDLCALC 94 02/10/2020   Lab Results  Component Value Date   TRIG 308 (H) 02/10/2020   Lab Results  Component Value Date   CHOLHDL 6.6 (H) 02/10/2020   Lab Results  Component Value Date   HGBA1C 5.4 02/10/2020      Assessment & Plan:   1. Chronic right-sided low back pain with right-sided sciatica - naproxen (NAPROSYN) 500 MG tablet; Take 1 tablet (500 mg  total) by mouth 2 (two) times daily as needed.  Dispense: 60 tablet; Refill: 11  2. Muscle spasm of right shoulder  3. Essential hypertension The current medical regimen is effective; blood pressure is stable at 143/77 today; continue present plan and medications as prescribed. She will continue to take medications as prescribed, to decrease high sodium intake, excessive alcohol intake, increase potassium intake, smoking cessation, and increase physical activity of at least 30 minutes of cardio activity daily. She will continue to follow Heart Healthy or DASH diet. - CBC with Differential - Comprehensive metabolic panel - Lipid Panel - TSH - Hemoglobin A1c - lisinopril (ZESTRIL) 10 MG tablet; Take 1 tablet (10 mg total) by mouth daily.  Dispense: 90 tablet; Refill: 3  4. Hyperglycemia Hgb A1c is stable at 5.4 today.  - Hemoglobin A1c  5. Hypothyroidism, unspecified type - levothyroxine (SYNTHROID) 50 MCG tablet; Take 1 tablet (50 mcg total) by mouth daily before breakfast.  Dispense: 90 tablet; Refill: 3  6. Anxiety and depression - busPIRone (BUSPAR) 15 MG tablet; Take 1 tablet (15 mg total) by mouth 2 (two) times daily.  Dispense: 180 tablet; Refill: 3 - sertraline (ZOLOFT) 100 MG tablet; Take 1 tablet (100 mg total) by mouth daily.  Dispense: 90 tablet; Refill: 3  7. Insomnia, unspecified type - traZODone (DESYREL) 50 MG tablet; Take 0.5-1 tablets (25-50 mg total) by mouth at bedtime as needed for sleep.  Dispense: 30 tablet; Refill: 11  8. Vitamin B12 deficiency - Vitamin B12  9. Vitamin D deficiency - Vitamin D, 25-hydroxy  10. Urinary frequency - PSA  11. Follow up She  will follow up in 6 months.  - Urinalysis Dipstick  Meds ordered this encounter  Medications  . busPIRone (BUSPAR) 15 MG tablet    Sig: Take 1 tablet (15 mg total) by mouth 2 (two) times daily.    Dispense:  180 tablet    Refill:  3  . levothyroxine (SYNTHROID) 50 MCG tablet    Sig: Take 1 tablet  (50 mcg total) by mouth daily before breakfast.    Dispense:  90 tablet    Refill:  3  . lisinopril (ZESTRIL) 10 MG tablet    Sig: Take 1 tablet (10 mg total) by mouth daily.    Dispense:  90 tablet    Refill:  3  . naproxen (NAPROSYN) 500 MG tablet    Sig: Take 1 tablet (500 mg total) by mouth 2 (two) times daily as needed.    Dispense:  60 tablet    Refill:  11  . traZODone (DESYREL) 50 MG tablet    Sig: Take 0.5-1 tablets (25-50 mg total) by mouth at bedtime as needed for sleep.    Dispense:  30 tablet    Refill:  11  . sertraline (ZOLOFT) 100 MG tablet    Sig: Take 1 tablet (100 mg total) by mouth daily.    Dispense:  90 tablet    Refill:  3    Orders Placed This Encounter  Procedures  . CBC with Differential  . Comprehensive metabolic panel  . Lipid Panel  . TSH  . PSA  . Vitamin B12  . Vitamin D, 25-hydroxy  . Hemoglobin A1c  . Urinalysis Dipstick    Referral Orders  No referral(s) requested today    Raliegh Ip,  MSN, FNP-BC [facility] [address] [place], [place] [address] [phone] fax  Problem List Items Addressed This Visit      Cardiovascular and Mediastinum   Essential hypertension   Relevant Medications   lisinopril (ZESTRIL) 10 MG tablet   Other Relevant Orders   CBC with Differential (Completed)   Comprehensive metabolic panel (Completed)   Lipid Panel (Completed)   TSH (Completed)   Hemoglobin A1c (Completed)     Endocrine   Hypothyroidism   Relevant Medications   levothyroxine (SYNTHROID) 50 MCG tablet     Nervous and Auditory   Chronic right-sided low back pain with right-sided sciatica - Primary   Relevant Medications   busPIRone (BUSPAR) 15 MG tablet   naproxen (NAPROSYN) 500 MG tablet   traZODone (DESYREL) 50 MG tablet   sertraline (ZOLOFT) 100 MG tablet     Other   Anxiety and depression   Relevant Medications    busPIRone (BUSPAR) 15 MG tablet   traZODone (DESYREL) 50 MG tablet   sertraline (ZOLOFT) 100 MG tablet   Muscle spasm of right shoulder   Vitamin D deficiency   Relevant Orders   Vitamin D, 25-hydroxy (Completed)    Other Visit Diagnoses    Hyperglycemia       Relevant Orders   Hemoglobin A1c (Completed)   Insomnia, unspecified type       Relevant Medications   traZODone (DESYREL) 50 MG tablet   Vitamin B12 deficiency       Relevant Orders   Vitamin B12 (Completed)   Urinary frequency       Relevant Orders   PSA (Completed)   Follow up       Relevant Orders   Urinalysis Dipstick (  Completed)      Meds ordered this encounter  Medications  . busPIRone (BUSPAR) 15 MG tablet    Sig: Take 1 tablet (15 mg total) by mouth 2 (two) times daily.    Dispense:  180 tablet    Refill:  3  . levothyroxine (SYNTHROID) 50 MCG tablet    Sig: Take 1 tablet (50 mcg total) by mouth daily before breakfast.    Dispense:  90 tablet    Refill:  3  . lisinopril (ZESTRIL) 10 MG tablet    Sig: Take 1 tablet (10 mg total) by mouth daily.    Dispense:  90 tablet    Refill:  3  . naproxen (NAPROSYN) 500 MG tablet    Sig: Take 1 tablet (500 mg total) by mouth 2 (two) times daily as needed.    Dispense:  60 tablet    Refill:  11  . traZODone (DESYREL) 50 MG tablet    Sig: Take 0.5-1 tablets (25-50 mg total) by mouth at bedtime as needed for sleep.    Dispense:  30 tablet    Refill:  11  . sertraline (ZOLOFT) 100 MG tablet    Sig: Take 1 tablet (100 mg total) by mouth daily.    Dispense:  90 tablet    Refill:  3    Follow-up: Return in about 1 year (around 02/09/2021).    Kallie Locks, FNP

## 2020-02-11 ENCOUNTER — Encounter: Payer: Self-pay | Admitting: Family Medicine

## 2020-02-11 LAB — CBC WITH DIFFERENTIAL/PLATELET
Basophils Absolute: 0 10*3/uL (ref 0.0–0.2)
Basos: 0 %
EOS (ABSOLUTE): 0.2 10*3/uL (ref 0.0–0.4)
Eos: 3 %
Hematocrit: 44.6 % (ref 37.5–51.0)
Hemoglobin: 14.6 g/dL (ref 13.0–17.7)
Immature Grans (Abs): 0 10*3/uL (ref 0.0–0.1)
Immature Granulocytes: 0 %
Lymphocytes Absolute: 1.3 10*3/uL (ref 0.7–3.1)
Lymphs: 26 %
MCH: 28.1 pg (ref 26.6–33.0)
MCHC: 32.7 g/dL (ref 31.5–35.7)
MCV: 86 fL (ref 79–97)
Monocytes Absolute: 0.3 10*3/uL (ref 0.1–0.9)
Monocytes: 6 %
Neutrophils Absolute: 3.3 10*3/uL (ref 1.4–7.0)
Neutrophils: 65 %
Platelets: 93 10*3/uL — CL (ref 150–450)
RBC: 5.2 x10E6/uL (ref 4.14–5.80)
RDW: 13.1 % (ref 11.6–15.4)
WBC: 5.1 10*3/uL (ref 3.4–10.8)

## 2020-02-11 LAB — COMPREHENSIVE METABOLIC PANEL
ALT: 32 IU/L (ref 0–44)
AST: 30 IU/L (ref 0–40)
Albumin/Globulin Ratio: 1.6 (ref 1.2–2.2)
Albumin: 4.5 g/dL (ref 3.8–4.8)
Alkaline Phosphatase: 110 IU/L (ref 48–121)
BUN/Creatinine Ratio: 10 (ref 10–24)
BUN: 10 mg/dL (ref 8–27)
Bilirubin Total: 0.5 mg/dL (ref 0.0–1.2)
CO2: 22 mmol/L (ref 20–29)
Calcium: 9 mg/dL (ref 8.6–10.2)
Chloride: 105 mmol/L (ref 96–106)
Creatinine, Ser: 1.04 mg/dL (ref 0.76–1.27)
GFR calc Af Amer: 86 mL/min/{1.73_m2} (ref >59)
GFR calc non Af Amer: 74 mL/min/{1.73_m2} (ref >59)
Globulin, Total: 2.9 g/dL (ref 1.5–4.5)
Glucose: 77 mg/dL (ref 65–99)
Potassium: 4.6 mmol/L (ref 3.5–5.2)
Sodium: 141 mmol/L (ref 134–144)
Total Protein: 7.4 g/dL (ref 6.0–8.5)

## 2020-02-11 LAB — LIPID PANEL
Chol/HDL Ratio: 6.6 ratio — ABNORMAL HIGH (ref 0.0–5.0)
Cholesterol, Total: 172 mg/dL (ref 100–199)
HDL: 26 mg/dL — ABNORMAL LOW (ref >39)
LDL Chol Calc (NIH): 94 mg/dL (ref 0–99)
Triglycerides: 308 mg/dL — ABNORMAL HIGH (ref 0–149)
VLDL Cholesterol Cal: 52 mg/dL — ABNORMAL HIGH (ref 5–40)

## 2020-02-11 LAB — HEMOGLOBIN A1C
Est. average glucose Bld gHb Est-mCnc: 108 mg/dL
Hgb A1c MFr Bld: 5.4 % (ref 4.8–5.6)

## 2020-02-11 LAB — PSA: Prostate Specific Ag, Serum: 2 ng/mL (ref 0.0–4.0)

## 2020-02-11 LAB — TSH: TSH: 2.08 u[IU]/mL (ref 0.450–4.500)

## 2020-02-11 LAB — VITAMIN D 25 HYDROXY (VIT D DEFICIENCY, FRACTURES): Vit D, 25-Hydroxy: 59.1 ng/mL (ref 30.0–100.0)

## 2020-02-11 LAB — VITAMIN B12: Vitamin B-12: 396 pg/mL (ref 232–1245)

## 2020-02-13 ENCOUNTER — Encounter: Payer: Self-pay | Admitting: Family Medicine

## 2020-02-13 MED ORDER — BUSPIRONE HCL 15 MG PO TABS: 15.0000 mg | ORAL_TABLET | Freq: Two times a day (BID) | ORAL | 3 refills | Status: DC

## 2020-02-13 MED ORDER — NAPROXEN 500 MG PO TABS: 500.0000 mg | ORAL_TABLET | Freq: Two times a day (BID) | ORAL | 11 refills | Status: DC | PRN

## 2020-02-13 MED ORDER — LEVOTHYROXINE SODIUM 50 MCG PO TABS: 50.0000 ug | ORAL_TABLET | Freq: Every day | ORAL | 3 refills | Status: DC

## 2020-02-13 MED ORDER — LISINOPRIL 10 MG PO TABS: 10.0000 mg | ORAL_TABLET | Freq: Every day | ORAL | 3 refills | Status: DC

## 2020-02-13 MED ORDER — SERTRALINE HCL 100 MG PO TABS: 100.0000 mg | ORAL_TABLET | Freq: Every day | ORAL | 3 refills | Status: DC

## 2020-02-13 MED ORDER — TRAZODONE HCL 50 MG PO TABS: 25.0000 mg | ORAL_TABLET | Freq: Every evening | ORAL | 11 refills | Status: DC | PRN

## 2020-02-20 ENCOUNTER — Telehealth: Payer: Self-pay | Admitting: Family Medicine

## 2020-02-20 NOTE — Progress Notes (Signed)
Pt was called to discuss her lab results. Pt didn't answer the phone so a message was left for her to call us back. @ [phone]

## 2020-02-21 NOTE — Telephone Encounter (Signed)
Sent to provider 

## 2020-03-06 ENCOUNTER — Other Ambulatory Visit: Payer: Self-pay | Admitting: Family Medicine

## 2020-03-06 DIAGNOSIS — E559 Vitamin D deficiency, unspecified: Secondary | ICD-10-CM

## 2020-03-20 ENCOUNTER — Other Ambulatory Visit: Payer: Self-pay | Admitting: Family Medicine

## 2020-03-20 DIAGNOSIS — I10 Essential (primary) hypertension: Secondary | ICD-10-CM

## 2020-04-17 ENCOUNTER — Other Ambulatory Visit: Payer: Self-pay | Admitting: Family Medicine

## 2020-04-17 DIAGNOSIS — E039 Hypothyroidism, unspecified: Secondary | ICD-10-CM

## 2020-06-13 ENCOUNTER — Other Ambulatory Visit: Payer: Self-pay | Admitting: Family Medicine

## 2020-06-13 DIAGNOSIS — E559 Vitamin D deficiency, unspecified: Secondary | ICD-10-CM

## 2020-09-06 ENCOUNTER — Other Ambulatory Visit: Payer: Self-pay | Admitting: Family Medicine

## 2020-09-06 DIAGNOSIS — E559 Vitamin D deficiency, unspecified: Secondary | ICD-10-CM

## 2020-11-29 ENCOUNTER — Other Ambulatory Visit: Payer: Self-pay | Admitting: Nurse Practitioner

## 2020-11-29 DIAGNOSIS — E559 Vitamin D deficiency, unspecified: Secondary | ICD-10-CM

## 2020-11-29 MED ORDER — VITAMIN D (ERGOCALCIFEROL) 1.25 MG (50000 UNIT) PO CAPS: 1.0000 | ORAL_CAPSULE | ORAL | 0 refills | Status: DC

## 2020-12-06 ENCOUNTER — Telehealth: Payer: Self-pay

## 2020-12-06 NOTE — Telephone Encounter (Signed)
Refill sent.

## 2020-12-06 NOTE — Telephone Encounter (Signed)
Med refill  Viamin D [facility] [address]

## 2020-12-11 ENCOUNTER — Encounter: Payer: Self-pay | Admitting: Family Medicine

## 2021-02-08 ENCOUNTER — Ambulatory Visit: Payer: Medicare Other | Admitting: Nurse Practitioner

## 2021-02-11 ENCOUNTER — Ambulatory Visit: Payer: Medicare Other | Admitting: Family Medicine

## 2021-02-28 ENCOUNTER — Telehealth: Payer: Self-pay

## 2021-02-28 NOTE — Telephone Encounter (Signed)
Patient is due for Annual Wellness Visit. Unsuccessful call to patient. No answer or voicemail to leave a message.

## 2021-03-18 ENCOUNTER — Ambulatory Visit: Payer: Medicare Other | Admitting: Nurse Practitioner

## 2021-04-15 ENCOUNTER — Encounter: Payer: Self-pay | Admitting: Nurse Practitioner

## 2021-04-15 ENCOUNTER — Other Ambulatory Visit: Payer: Self-pay

## 2021-04-15 ENCOUNTER — Ambulatory Visit (INDEPENDENT_AMBULATORY_CARE_PROVIDER_SITE_OTHER): Payer: Medicare Other | Admitting: Nurse Practitioner

## 2021-04-15 VITALS — BP 142/87 | HR 59 | Temp 97.0°F | Ht 60.0 in | Wt 120.8 lb

## 2021-04-15 DIAGNOSIS — G8929 Other chronic pain: Secondary | ICD-10-CM

## 2021-04-15 DIAGNOSIS — M5441 Lumbago with sciatica, right side: Secondary | ICD-10-CM

## 2021-04-15 DIAGNOSIS — E559 Vitamin D deficiency, unspecified: Secondary | ICD-10-CM

## 2021-04-15 DIAGNOSIS — F419 Anxiety disorder, unspecified: Secondary | ICD-10-CM

## 2021-04-15 DIAGNOSIS — Z13220 Encounter for screening for lipoid disorders: Secondary | ICD-10-CM

## 2021-04-15 DIAGNOSIS — E039 Hypothyroidism, unspecified: Secondary | ICD-10-CM

## 2021-04-15 DIAGNOSIS — Z125 Encounter for screening for malignant neoplasm of prostate: Secondary | ICD-10-CM

## 2021-04-15 DIAGNOSIS — F32A Depression, unspecified: Secondary | ICD-10-CM

## 2021-04-15 DIAGNOSIS — Z0000 Encounter for general adult medical examination without abnormal findings: Secondary | ICD-10-CM

## 2021-04-15 DIAGNOSIS — M79672 Pain in left foot: Secondary | ICD-10-CM

## 2021-04-15 DIAGNOSIS — G4700 Insomnia, unspecified: Secondary | ICD-10-CM

## 2021-04-15 DIAGNOSIS — Z23 Encounter for immunization: Secondary | ICD-10-CM | POA: Diagnosis not present

## 2021-04-15 DIAGNOSIS — I10 Essential (primary) hypertension: Secondary | ICD-10-CM | POA: Diagnosis not present

## 2021-04-15 DIAGNOSIS — Z1159 Encounter for screening for other viral diseases: Secondary | ICD-10-CM

## 2021-04-15 LAB — POCT URINALYSIS DIP (CLINITEK)
Bilirubin, UA: NEGATIVE
Blood, UA: NEGATIVE
Glucose, UA: NEGATIVE mg/dL
Ketones, POC UA: NEGATIVE mg/dL
Leukocytes, UA: NEGATIVE
Nitrite, UA: NEGATIVE
POC PROTEIN,UA: NEGATIVE
Spec Grav, UA: >=1.03 — AB (ref 1.010–1.025)
Urobilinogen, UA: 0.2 E.U./dL
pH, UA: 5.5 (ref 5.0–8.0)

## 2021-04-15 MED ORDER — BUSPIRONE HCL 15 MG PO TABS: 15.0000 mg | ORAL_TABLET | Freq: Two times a day (BID) | ORAL | 3 refills | Status: DC

## 2021-04-15 MED ORDER — LISINOPRIL 20 MG PO TABS: 20.0000 mg | ORAL_TABLET | Freq: Every day | ORAL | 3 refills | Status: DC

## 2021-04-15 MED ORDER — SERTRALINE HCL 100 MG PO TABS: 100.0000 mg | ORAL_TABLET | Freq: Every day | ORAL | 3 refills | Status: DC

## 2021-04-15 MED ORDER — TRAZODONE HCL 50 MG PO TABS: 25.0000 mg | ORAL_TABLET | Freq: Every evening | ORAL | 11 refills | Status: DC | PRN

## 2021-04-15 MED ORDER — NAPROXEN 500 MG PO TABS: 500.0000 mg | ORAL_TABLET | Freq: Two times a day (BID) | ORAL | 11 refills | Status: DC | PRN

## 2021-04-15 NOTE — Patient Instructions (Signed)
Preventive Care 65 Years and Older, Male °Preventive care refers to lifestyle choices and visits with your health care provider that can promote health and wellness. Preventive care visits are also called wellness exams. °What can I expect for my preventive care visit? °Counseling °During your preventive care visit, your health care provider may ask about your: °Medical history, including: °Past medical problems. °Family medical history. °History of falls. °Current health, including: °Emotional well-being. °Home life and relationship well-being. °Sexual activity. °Memory and ability to understand (cognition). °Lifestyle, including: °Alcohol, nicotine or tobacco, and drug use. °Access to firearms. °Diet, exercise, and sleep habits. °Work and work environment. °Sunscreen use. °Safety issues such as seatbelt and bike helmet use. °Physical exam °Your health care provider will check your: °Height and weight. These may be used to calculate your BMI (body mass index). BMI is a measurement that tells if you are at a healthy weight. °Waist circumference. This measures the distance around your waistline. This measurement also tells if you are at a healthy weight and may help predict your risk of certain diseases, such as type 2 diabetes and high blood pressure. °Heart rate and blood pressure. °Body temperature. °Skin for abnormal spots. °What immunizations do I need? °Vaccines are usually given at various ages, according to a schedule. Your health care provider will recommend vaccines for you based on your age, medical history, and lifestyle or other factors, such as travel or where you work. °What tests do I need? °Screening °Your health care provider may recommend screening tests for certain conditions. This may include: °Lipid and cholesterol levels. °Diabetes screening. This is done by checking your blood sugar (glucose) after you have not eaten for a while (fasting). °Hepatitis C test. °Hepatitis B test. °HIV (human  immunodeficiency virus) test. °STI (sexually transmitted infection) testing, if you are at risk. °Lung cancer screening. °Colorectal cancer screening. °Prostate cancer screening. °Abdominal aortic aneurysm (AAA) screening. You may need this if you are a current or former smoker. °Talk with your health care provider about your test results, treatment options, and if necessary, the need for more tests. °Follow these instructions at home: °Eating and drinking ° °Eat a diet that includes fresh fruits and vegetables, whole grains, lean protein, and low-fat dairy products. Limit your intake of foods with high amounts of sugar, saturated fats, and salt. °Take vitamin and mineral supplements as recommended by your health care provider. °Do not drink alcohol if your health care provider tells you not to drink. °If you drink alcohol: °Limit how much you have to 0-2 drinks a day. °Know how much alcohol is in your drink. In the [place] one drink equals one 12 oz bottle of beer (355 mL), one 5 oz glass of wine (148 mL), or one 1½ oz glass of hard liquor (44 mL). °Lifestyle °Brush your teeth every morning and night with fluoride toothpaste. Floss one time each day. °Exercise for at least 30 minutes 5 or more days each week. °Do not use any products that contain nicotine or tobacco. These products include cigarettes, chewing tobacco, and vaping devices, such as e-cigarettes. If you need help quitting, ask your health care provider. °Do not use drugs. °If you are sexually active, practice safe sex. Use a condom or other form of protection to prevent STIs. °Take aspirin only as told by your health care provider. Make sure that you understand how much to take and what form to take. Work with your health care provider to find out whether it is safe and   beneficial for you to take aspirin daily. °Ask your health care provider if you need to take a cholesterol-lowering medicine (statin). °Find healthy ways to manage stress, such  as: °Meditation, yoga, or listening to music. °Journaling. °Talking to a trusted person. °Spending time with friends and family. °Safety °Always wear your seat belt while driving or riding in a vehicle. °Do not drive: °If you have been drinking alcohol. Do not ride with someone who has been drinking. °When you are tired or distracted. °While texting. °If you have been using any mind-altering substances or drugs. °Wear a helmet and other protective equipment during sports activities. °If you have firearms in your house, make sure you follow all gun safety procedures. °Minimize exposure to UV radiation to reduce your risk of skin cancer. °What's next? °Visit your health care provider once a year for an annual wellness visit. °Ask your health care provider how often you should have your eyes and teeth checked. °Stay up to date on all vaccines. °This information is not intended to replace advice given to you by your health care provider. Make sure you discuss any questions you have with your health care provider. °Document Revised: 11/21/2020 Document Reviewed: 11/21/2020 °Elsevier Patient Education © 2022 [employer] ° °

## 2021-04-15 NOTE — Progress Notes (Signed)
[facility] Patient [facility] [address] [place], [place]  [address] Phone:  [phone]   Fax:  [phone]   Established Patient Office Visit  Subjective:  Patient ID: Shawn Casey, male    DOB: Sep 22, 1955  Age: 65 y.o. MRN: [id number]  CC:  Chief Complaint  Patient presents with   Follow-up    Pt is here today for his regular follow up visit. No concerns or issues to discuss today. Pt is wanting his flu vaccine today.    HPI Shawn Casey presents for follow up. He  has a past medical history of Anxiety and depression, Chronic low back pain with right-sided sciatica, Hyperglycemia, Hyperlipidemia (02/2020), Hypertension, Hypothyroidism, Insomnia, Memory loss, and Thrombocytopenia (HCC) (02/2020).    He his complaining of lower back pain. He denies any recent falls or injuries to back. The pain has been going on for 2 weeks. He denies any urinary symptoms. He does hydrate.  He has taken [employer] which was effective. He denies any specific things making the pain worse.    He has pain in his left foot; a stone fell on his feet when he was young. He has occasional flare up with pain. He denies any swelling or weakness. He has tingling but no numbness.  He feels like the pain is getting worse in the few weeks.   His daughter is concern about his BP. She feels like his BP is always in this range and she would like to see it be a little lower since he is using the lisinopril 10 mg. His BP is not monitored at home. He does not exercise. His weight is down 8 pounds. He is monitoring the sodium in his diet. Denies headache, dizziness, visual changes, shortness of breath, dyspnea on exertion, chest pain, nausea, vomiting or any edema.    Past Medical History:  Diagnosis Date   Anxiety and depression    Chronic low back pain with right-sided sciatica    Hyperglycemia    Hyperlipidemia 02/2020   Hypertension    Hypothyroidism    Insomnia    Memory loss     Thrombocytopenia (HCC) 02/2020    History reviewed. No pertinent surgical history.  Family History  Problem Relation Age of Onset   CAD Neg Hx     Social History   Socioeconomic History   Marital status: Married    Spouse name: Not on file   Number of children: Not on file   Years of education: Not on file   Highest education level: Not on file  Occupational History   Not on file  Tobacco Use   Smoking status: Never   Smokeless tobacco: Never  Vaping Use   Vaping Use: Never used  Substance and Sexual Activity   Alcohol use: No   Drug use: No   Sexual activity: Not Currently  Other Topics Concern   Not on file  Social History Narrative   ** Merged History Encounter **       Social Determinants of Health   Financial Resource Strain: Not on file  Food Insecurity: Not on file  Transportation Needs: Not on file  Physical Activity: Not on file  Stress: Not on file  Social Connections: Not on file  Intimate Partner Violence: Not on file    Outpatient Medications Prior to Visit  Medication Sig Dispense Refill   levothyroxine (SYNTHROID) 50 MCG tablet Take 1 tablet (50 mcg total) by mouth daily before breakfast. 90 tablet 3  Vitamin D, Ergocalciferol, (DRISDOL) 1.25 MG (50000 UNIT) CAPS capsule Take 1 capsule (50,000 Units total) by mouth once a week. 12 capsule 0   busPIRone (BUSPAR) 15 MG tablet Take 1 tablet (15 mg total) by mouth 2 (two) times daily. 180 tablet 3   lisinopril (ZESTRIL) 10 MG tablet Take 1 tablet (10 mg total) by mouth daily. 90 tablet 3   naproxen (NAPROSYN) 500 MG tablet Take 1 tablet (500 mg total) by mouth 2 (two) times daily as needed. 60 tablet 11   traZODone (DESYREL) 50 MG tablet Take 0.5-1 tablets (25-50 mg total) by mouth at bedtime as needed for sleep. 30 tablet 11   sertraline (ZOLOFT) 100 MG tablet Take 1 tablet (100 mg total) by mouth daily. (Patient not taking: Reported on 04/15/2021) 90 tablet 3   No facility-administered medications  prior to visit.    No Known Allergies  ROS Review of Systems    Objective:    Physical Exam Constitutional:      General: He is not in acute distress.    Appearance: He is normal weight. He is not ill-appearing, toxic-appearing or diaphoretic.  HENT:     Head: Normocephalic and atraumatic.     Nose: Nose normal.     Mouth/Throat:     Mouth: Mucous membranes are moist.  Cardiovascular:     Rate and Rhythm: Normal rate and regular rhythm.     Pulses: Normal pulses.     Heart sounds: Normal heart sounds.  Pulmonary:     Effort: Pulmonary effort is normal.     Breath sounds: Normal breath sounds.  Abdominal:     General: Bowel sounds are normal.     Palpations: Abdomen is soft.     Tenderness: There is abdominal tenderness (Rib area on Right Murphy's negative).  Musculoskeletal:     Cervical back: Normal range of motion.     Lumbar back: Tenderness present.     Right lower leg: No edema.     Left lower leg: No edema.  Skin:    General: Skin is warm and dry.     Capillary Refill: Capillary refill takes less than 2 seconds.  Neurological:     General: No focal deficit present.     Mental Status: He is alert and oriented to person, place, and time.  Psychiatric:        Mood and Affect: Mood normal.        Behavior: Behavior normal.        Thought Content: Thought content normal.        Judgment: Judgment normal.    BP (!) 142/87   Pulse (!) 59   Temp (!) 97 F (36.1 C)   Ht 5' (1.524 m)   Wt 120 lb 12.8 oz (54.8 kg)   SpO2 98%   BMI 23.59 kg/m  Wt Readings from Last 3 Encounters:  04/15/21 120 lb 12.8 oz (54.8 kg)  02/10/20 128 lb (58.1 kg)  01/25/19 128 lb (58.1 kg)     Health Maintenance Due  Topic Date Due   Hepatitis C Screening  Never done    There are no preventive care reminders to display for this patient.  Lab Results  Component Value Date   TSH 2.080 02/10/2020   Lab Results  Component Value Date   WBC 5.1 02/10/2020   HGB 14.6  02/10/2020   HCT 44.6 02/10/2020   MCV 86 02/10/2020   PLT 93 (LL) 02/10/2020   Lab Results  Component Value Date   NA 141 02/10/2020   K 4.6 02/10/2020   CO2 22 02/10/2020   GLUCOSE 77 02/10/2020   BUN 10 02/10/2020   CREATININE 1.04 02/10/2020   BILITOT 0.5 02/10/2020   ALKPHOS 110 02/10/2020   AST 30 02/10/2020   ALT 32 02/10/2020   PROT 7.4 02/10/2020   ALBUMIN 4.5 02/10/2020   CALCIUM 9.0 02/10/2020   Lab Results  Component Value Date   CHOL 172 02/10/2020   Lab Results  Component Value Date   HDL 26 (L) 02/10/2020   Lab Results  Component Value Date   LDLCALC 94 02/10/2020   Lab Results  Component Value Date   TRIG 308 (H) 02/10/2020   Lab Results  Component Value Date   CHOLHDL 6.6 (H) 02/10/2020   Lab Results  Component Value Date   HGBA1C 5.4 02/10/2020      Assessment & Plan:   Problem List Items Addressed This Visit       Cardiovascular and Mediastinum   Essential hypertension - Primary Increased Lisinopril 20 mg daily  Discussed age and BP  Discussed side effects and evidence of hypotension Encouraged on going compliance with current medication regimen Encouraged home monitoring and recording BP <130/80 Eating a heart-healthy diet with less salt Encouraged regular physical activity     Relevant Medications   lisinopril (ZESTRIL) 20 MG tablet   Other Relevant Orders   POCT URINALYSIS DIP (CLINITEK) (Completed)   Comp. Metabolic Panel (12)     Endocrine   Hypothyroidism Weight loss will evaluate levels Continue with current regimen.   Relevant Orders   TSH   T3   T4, free     Nervous and Auditory   Chronic right-sided low back pain with right-sided sciatica Persistent  Will consider PT post image results   Relevant Medications   busPIRone (BUSPAR) 15 MG tablet   naproxen (NAPROSYN) 500 MG tablet   traZODone (DESYREL) 50 MG tablet   sertraline (ZOLOFT) 100 MG tablet   Other Relevant Orders   DG Lumbar Spine Complete      Other   Vitamin D deficiency Labs pending    Anxiety and depression Stable Continue with current regimen.  No changes warranted. Good patient compliance.    Relevant Medications   busPIRone (BUSPAR) 15 MG tablet   traZODone (DESYREL) 50 MG tablet   sertraline (ZOLOFT) 100 MG tablet   Other Visit Diagnoses     Needs flu shot       Relevant Orders   Flu Vaccine QUAD 72mo+IM (Fluarix, Fluzone & Alfiuria Quad PF)   Encounter for hepatitis C screening test for low risk patient       Relevant Orders   Hepatitis C antibody   Left foot pain     Persistent and worsening  Pending xray   Relevant Orders   DG Foot Complete Left   Screening for cholesterol level       Relevant Orders   Lipid panel   Insomnia, unspecified type   \ Stable  We discussed at length sleep hygiene measures including regular sleep schedule, optimal sleep environment, and relaxing presleep rituals. Avoid daytime naps. Avoid caffeine after noon. Avoid excess alcohol. Avoid tobacco. Recommended daily exercise      Relevant Medications   traZODone (DESYREL) 50 MG tablet   Screening for prostate cancer     Insurance would not cover PSA screening    Healthcare maintenance           Meds  ordered this encounter  Medications   lisinopril (ZESTRIL) 20 MG tablet    Sig: Take 1 tablet (20 mg total) by mouth daily.    Dispense:  90 tablet    Refill:  3    Order Specific Question:   Supervising Provider    Answer:   Quentin Angst [[id number]]   busPIRone (BUSPAR) 15 MG tablet    Sig: Take 1 tablet (15 mg total) by mouth 2 (two) times daily.    Dispense:  180 tablet    Refill:  3    Order Specific Question:   Supervising Provider    Answer:   Quentin Angst [[id number]]   naproxen (NAPROSYN) 500 MG tablet    Sig: Take 1 tablet (500 mg total) by mouth 2 (two) times daily as needed.    Dispense:  60 tablet    Refill:  11    Order Specific Question:   Supervising Provider    Answer:   Quentin Angst [[id number]]   traZODone (DESYREL) 50 MG tablet    Sig: Take 0.5-1 tablets (25-50 mg total) by mouth at bedtime as needed for sleep.    Dispense:  30 tablet    Refill:  11    Order Specific Question:   Supervising Provider    Answer:   Quentin Angst [[id number]]   sertraline (ZOLOFT) 100 MG tablet    Sig: Take 1 tablet (100 mg total) by mouth daily.    Dispense:  90 tablet    Refill:  3    Order Specific Question:   Supervising Provider    Answer:   Quentin Angst [id number]    Follow-up: Return in about 6 weeks (around 05/27/2021) for Follow up HTN [id number].    Barbette Merino, NP

## 2021-04-16 LAB — COMP. METABOLIC PANEL (12)
AST: 24 IU/L (ref 0–40)
Albumin/Globulin Ratio: 1.5 (ref 1.2–2.2)
Albumin: 4.4 g/dL (ref 3.8–4.8)
Alkaline Phosphatase: 93 IU/L (ref 44–121)
BUN/Creatinine Ratio: 14 (ref 10–24)
BUN: 16 mg/dL (ref 8–27)
Bilirubin Total: 0.6 mg/dL (ref 0.0–1.2)
Calcium: 9.1 mg/dL (ref 8.6–10.2)
Chloride: 107 mmol/L — ABNORMAL HIGH (ref 96–106)
Creatinine, Ser: 1.14 mg/dL (ref 0.76–1.27)
Globulin, Total: 3 g/dL (ref 1.5–4.5)
Glucose: 72 mg/dL (ref 70–99)
Potassium: 4.6 mmol/L (ref 3.5–5.2)
Sodium: 143 mmol/L (ref 134–144)
Total Protein: 7.4 g/dL (ref 6.0–8.5)
eGFR: 70 mL/min/{1.73_m2} (ref >59)

## 2021-04-16 LAB — T4, FREE: Free T4: 1.23 ng/dL (ref 0.82–1.77)

## 2021-04-16 LAB — TSH: TSH: 1.09 u[IU]/mL (ref 0.450–4.500)

## 2021-04-16 LAB — LIPID PANEL
Chol/HDL Ratio: 6.1 ratio — ABNORMAL HIGH (ref 0.0–5.0)
Cholesterol, Total: 178 mg/dL (ref 100–199)
HDL: 29 mg/dL — ABNORMAL LOW (ref >39)
LDL Chol Calc (NIH): 116 mg/dL — ABNORMAL HIGH (ref 0–99)
Triglycerides: 184 mg/dL — ABNORMAL HIGH (ref 0–149)
VLDL Cholesterol Cal: 33 mg/dL (ref 5–40)

## 2021-04-16 LAB — HEPATITIS C ANTIBODY: Hep C Virus Ab: <0.1 s/co ratio (ref 0.0–0.9)

## 2021-04-16 LAB — T3: T3, Total: 92 ng/dL (ref 71–180)

## 2021-04-19 DIAGNOSIS — E039 Hypothyroidism, unspecified: Secondary | ICD-10-CM

## 2021-04-19 MED ORDER — LEVOTHYROXINE SODIUM 50 MCG PO TABS: 50.0000 ug | ORAL_TABLET | Freq: Every day | ORAL | 3 refills | Status: DC

## 2021-04-26 ENCOUNTER — Ambulatory Visit (HOSPITAL_COMMUNITY)
Admission: RE | Admit: 2021-04-26 | Discharge: 2021-04-26 | Disposition: A | Discharge status: Home / Self Care | Payer: Medicare Other | Source: Ambulatory Visit | Attending: Nurse Practitioner | Admitting: Nurse Practitioner

## 2021-04-26 DIAGNOSIS — G8929 Other chronic pain: Secondary | ICD-10-CM | POA: Insufficient documentation

## 2021-04-26 DIAGNOSIS — M79672 Pain in left foot: Secondary | ICD-10-CM | POA: Insufficient documentation

## 2021-04-26 DIAGNOSIS — M5441 Lumbago with sciatica, right side: Secondary | ICD-10-CM | POA: Insufficient documentation

## 2021-05-08 LAB — SPECIMEN STATUS REPORT

## 2021-05-08 LAB — VITAMIN D 25 HYDROXY (VIT D DEFICIENCY, FRACTURES): Vit D, 25-Hydroxy: 87 ng/mL (ref 30.0–100.0)

## 2021-05-29 ENCOUNTER — Other Ambulatory Visit: Payer: Self-pay | Admitting: Nurse Practitioner

## 2021-05-29 DIAGNOSIS — G8929 Other chronic pain: Secondary | ICD-10-CM

## 2021-10-14 ENCOUNTER — Ambulatory Visit (INDEPENDENT_AMBULATORY_CARE_PROVIDER_SITE_OTHER): Payer: Medicare Other | Admitting: Nurse Practitioner

## 2021-10-14 ENCOUNTER — Encounter: Payer: Self-pay | Admitting: Nurse Practitioner

## 2021-10-14 ENCOUNTER — Ambulatory Visit: Payer: Medicare Other | Admitting: Nurse Practitioner

## 2021-10-14 VITALS — BP 165/93 | HR 72 | Resp 16 | Ht 60.0 in

## 2021-10-14 DIAGNOSIS — E039 Hypothyroidism, unspecified: Secondary | ICD-10-CM

## 2021-10-14 DIAGNOSIS — G8929 Other chronic pain: Secondary | ICD-10-CM

## 2021-10-14 DIAGNOSIS — G4700 Insomnia, unspecified: Secondary | ICD-10-CM | POA: Diagnosis not present

## 2021-10-14 DIAGNOSIS — I10 Essential (primary) hypertension: Secondary | ICD-10-CM | POA: Diagnosis not present

## 2021-10-14 DIAGNOSIS — F419 Anxiety disorder, unspecified: Secondary | ICD-10-CM | POA: Diagnosis not present

## 2021-10-14 DIAGNOSIS — M5441 Lumbago with sciatica, right side: Secondary | ICD-10-CM

## 2021-10-14 DIAGNOSIS — E559 Vitamin D deficiency, unspecified: Secondary | ICD-10-CM

## 2021-10-14 DIAGNOSIS — F32A Depression, unspecified: Secondary | ICD-10-CM

## 2021-10-14 MED ORDER — BUSPIRONE HCL 15 MG PO TABS: 15.0000 mg | ORAL_TABLET | Freq: Two times a day (BID) | ORAL | 3 refills | Status: AC

## 2021-10-14 MED ORDER — LEVOTHYROXINE SODIUM 50 MCG PO TABS: 50.0000 ug | ORAL_TABLET | Freq: Every day | ORAL | 3 refills | Status: DC

## 2021-10-14 MED ORDER — SERTRALINE HCL 100 MG PO TABS: 100.0000 mg | ORAL_TABLET | Freq: Every day | ORAL | 3 refills | Status: AC

## 2021-10-14 MED ORDER — LISINOPRIL 20 MG PO TABS: 20.0000 mg | ORAL_TABLET | Freq: Every day | ORAL | 3 refills | Status: DC

## 2021-10-14 MED ORDER — NAPROXEN 500 MG PO TABS: 500.0000 mg | ORAL_TABLET | Freq: Two times a day (BID) | ORAL | 11 refills | Status: DC | PRN

## 2021-10-14 MED ORDER — TRAZODONE HCL 50 MG PO TABS: 25.0000 mg | ORAL_TABLET | Freq: Every evening | ORAL | 11 refills | Status: DC | PRN

## 2021-10-14 NOTE — Progress Notes (Signed)
@Patient  ID: , male    DOB: 02-Jul-1955, 66 y.o.   MRN: [id number] ? ?No chief complaint on file. ? ? ?Referring provider: ?[id number], NP ? ?HPI ? ?[facility] presents for follow up. He  has a past medical history of Anxiety and depression, Chronic low back pain with right-sided sciatica, Hyperglycemia, Hyperlipidemia (02/2020), Hypertension, Hypothyroidism, Insomnia, Memory loss, and Thrombocytopenia (HCC) (02/2020).  ? ?Patient presents today for follow-up visit.  Patient states that he has been doing well since last visit.  Patient states that he is compliant with his medications.  He did not take his blood pressure medicine this morning.  His blood pressure was slightly elevated in the office today.  We discussed that he does need to take his medication when he gets home.  Patient is agreeable.  Patient states that he did have 2 teeth removed last week.  He states that he did take a round of antibiotics for that.  He does have some itching and pain with swelling to the left side of his face due to this.  He states that it is improving.Denies f/c/s, n/v/d, hemoptysis, PND, leg swelling ?Denies chest pain or edema ? ? ?No Known Allergies ? ?Immunization History  ?Administered Date(s) Administered  ? Influenza,inj,Quad PF,6+ Mos 04/15/2021  ? PFIZER(Purple Top)SARS-COV-2 Vaccination 08/25/2019, 09/13/2019  ? ? ?Past Medical History:  ?Diagnosis Date  ? Anxiety and depression   ? Chronic low back pain with right-sided sciatica   ? Hyperglycemia   ? Hyperlipidemia 02/2020  ? Hypertension   ? Hypothyroidism   ? Insomnia   ? Memory loss   ? Thrombocytopenia (HCC) 02/2020  ? ? ?Tobacco History: ?Social History  ? ?Tobacco Use  ?Smoking Status Never  ?Smokeless Tobacco Never  ? ?Counseling given: Not Answered ? ? ?Outpatient Encounter Medications as of 10/14/2021  ?Medication Sig  ? busPIRone (BUSPAR) 15 MG tablet Take 1 tablet (15 mg total) by mouth 2 (two) times daily.  ?  levothyroxine (SYNTHROID) 50 MCG tablet Take 1 tablet (50 mcg total) by mouth daily before breakfast.  ? lisinopril (ZESTRIL) 20 MG tablet Take 1 tablet (20 mg total) by mouth daily.  ? naproxen (NAPROSYN) 500 MG tablet Take 1 tablet (500 mg total) by mouth 2 (two) times daily as needed.  ? sertraline (ZOLOFT) 100 MG tablet Take 1 tablet (100 mg total) by mouth daily.  ? traZODone (DESYREL) 50 MG tablet Take 0.5-1 tablets (25-50 mg total) by mouth at bedtime as needed for sleep.  ? Vitamin D, Ergocalciferol, (DRISDOL) 1.25 MG (50000 UNIT) CAPS capsule Take 1 capsule (50,000 Units total) by mouth once a week.  ? [DISCONTINUED] busPIRone (BUSPAR) 15 MG tablet Take 1 tablet (15 mg total) by mouth 2 (two) times daily.  ? [DISCONTINUED] levothyroxine (SYNTHROID) 50 MCG tablet Take 1 tablet (50 mcg total) by mouth daily before breakfast.  ? [DISCONTINUED] lisinopril (ZESTRIL) 20 MG tablet Take 1 tablet (20 mg total) by mouth daily.  ? [DISCONTINUED] naproxen (NAPROSYN) 500 MG tablet Take 1 tablet (500 mg total) by mouth 2 (two) times daily as needed.  ? [DISCONTINUED] sertraline (ZOLOFT) 100 MG tablet Take 1 tablet (100 mg total) by mouth daily.  ? [DISCONTINUED] traZODone (DESYREL) 50 MG tablet Take 0.5-1 tablets (25-50 mg total) by mouth at bedtime as needed for sleep.  ? ?No facility-administered encounter medications on file as of 10/14/2021.  ? ? ? ?Review of Systems ? ?Review of Systems  ?Constitutional: Negative.   ?  HENT: Negative.    ?Cardiovascular: Negative.   ?Gastrointestinal: Negative.   ?Skin:   ?     Pain and swelling to left side of face due to recent dental exrtraction  ?Allergic/Immunologic: Negative.   ?Neurological: Negative.   ?Psychiatric/Behavioral: Negative.     ? ? ? ?Physical Exam ? ?BP (!) 165/93   Pulse 72   Resp 16   Ht 5' (1.524 m)   SpO2 98%   BMI 23.59 kg/m?  ? ?Wt Readings from Last 5 Encounters:  ?04/15/21 120 lb 12.8 oz (54.8 kg)  ?02/10/20 128 lb (58.1 kg)  ?01/25/19 128 lb (58.1 kg)   ?02/23/18 129 lb 12.8 oz (58.9 kg)  ?02/16/18 128 lb (58.1 kg)  ? ? ? ?Physical Exam ?Vitals and nursing note reviewed.  ?Constitutional:   ?   General: He is not in acute distress. ?   Appearance: He is well-developed.  ?HENT:  ?   Mouth/Throat:  ? ?   Comments: Recent dental extraction appears to be healing well. Swelling noted.  ?Cardiovascular:  ?   Rate and Rhythm: Normal rate and regular rhythm.  ?Pulmonary:  ?   Effort: Pulmonary effort is normal.  ?   Breath sounds: Normal breath sounds.  ?Skin: ?   General: Skin is warm and dry.  ?Neurological:  ?   Mental Status: He is alert and oriented to person, place, and time.  ? ? ? ?Lab Results: ? ?CBC ?   ?Component Value Date/Time  ? WBC 5.1 02/10/2020 1218  ? WBC 5.2 02/23/2014 1237  ? RBC 5.20 02/10/2020 1218  ? RBC 4.99 02/23/2014 1237  ? HGB 14.6 02/10/2020 1218  ? HCT 44.6 02/10/2020 1218  ? PLT 93 (LL) 02/10/2020 1218  ? MCV 86 02/10/2020 1218  ? MCH 28.1 02/10/2020 1218  ? MCH 27.5 02/23/2014 1237  ? MCHC 32.7 02/10/2020 1218  ? MCHC 34.0 02/23/2014 1237  ? RDW 13.1 02/10/2020 1218  ? LYMPHSABS 1.3 02/10/2020 1218  ? MONOABS 0.2 02/23/2014 1237  ? EOSABS 0.2 02/10/2020 1218  ? BASOSABS 0.0 02/10/2020 1218  ? ? ?BMET ?   ?Component Value Date/Time  ? NA 143 04/15/2021 0947  ? K 4.6 04/15/2021 0947  ? CL 107 (H) 04/15/2021 0947  ? CO2 22 02/10/2020 1218  ? GLUCOSE 72 04/15/2021 0947  ? GLUCOSE 88 02/23/2014 1337  ? BUN 16 04/15/2021 0947  ? CREATININE 1.14 04/15/2021 0947  ? CALCIUM 9.1 04/15/2021 0947  ? GFRNONAA 74 02/10/2020 1218  ? GFRAA 86 02/10/2020 1218  ? ? ?BNP ?No results found for: BNP ? ?ProBNP ?No results found for: PROBNP ? ?Imaging: ?No results found. ? ? ?Assessment & Plan:  ? ?Essential hypertension ?- CBC ?- Comprehensive metabolic panel ? ?2. Anxiety and depression ? ?- sertraline (ZOLOFT) 100 MG tablet; Take 1 tablet (100 mg total) by mouth daily.  Dispense: 90 tablet; Refill: 3 ?- busPIRone (BUSPAR) 15 MG tablet; Take 1 tablet (15 mg  total) by mouth 2 (two) times daily.  Dispense: 180 tablet; Refill: 3 ? ?3. Insomnia, unspecified type ? ?- traZODone (DESYREL) 50 MG tablet; Take 0.5-1 tablets (25-50 mg total) by mouth at bedtime as needed for sleep.  Dispense: 30 tablet; Refill: 11 ? ?4. Hypothyroidism, unspecified type ? ?- levothyroxine (SYNTHROID) 50 MCG tablet; Take 1 tablet (50 mcg total) by mouth daily before breakfast.  Dispense: 90 tablet; Refill: 3 ?- TSH ? ?5. Chronic right-sided low back pain with right-sided sciatica ? ?- naproxen (  NAPROSYN) 500 MG tablet; Take 1 tablet (500 mg total) by mouth 2 (two) times daily as needed.  Dispense: 60 tablet; Refill: 11 ? ?6. Vitamin D deficiency ? ?- Vitamin D, 25-hydroxy ? ?7. Status post dental extraction ? ?Please follow with oral surgeon ? ? ?Follow up: ? ?Follow up in 6 months or sooner if needed ? ? ? ? ?Ivonne Andrewonya S Daishon Chui, NP ?10/14/2021 ? ?

## 2021-10-14 NOTE — Assessment & Plan Note (Signed)
-   CBC ?- Comprehensive metabolic panel ? ?2. Anxiety and depression ? ?- sertraline (ZOLOFT) 100 MG tablet; Take 1 tablet (100 mg total) by mouth daily.  Dispense: 90 tablet; Refill: 3 ?- busPIRone (BUSPAR) 15 MG tablet; Take 1 tablet (15 mg total) by mouth 2 (two) times daily.  Dispense: 180 tablet; Refill: 3 ? ?3. Insomnia, unspecified type ? ?- traZODone (DESYREL) 50 MG tablet; Take 0.5-1 tablets (25-50 mg total) by mouth at bedtime as needed for sleep.  Dispense: 30 tablet; Refill: 11 ? ?4. Hypothyroidism, unspecified type ? ?- levothyroxine (SYNTHROID) 50 MCG tablet; Take 1 tablet (50 mcg total) by mouth daily before breakfast.  Dispense: 90 tablet; Refill: 3 ?- TSH ? ?5. Chronic right-sided low back pain with right-sided sciatica ? ?- naproxen (NAPROSYN) 500 MG tablet; Take 1 tablet (500 mg total) by mouth 2 (two) times daily as needed.  Dispense: 60 tablet; Refill: 11 ? ?6. Vitamin D deficiency ? ?- Vitamin D, 25-hydroxy ? ?7. Status post dental extraction ? ?Please follow with oral surgeon ? ? ?Follow up: ? ?Follow up in 6 months or sooner if needed ? ?

## 2021-10-14 NOTE — Patient Instructions (Addendum)
1. Essential hypertension ? ?- CBC ?- Comprehensive metabolic panel ? ?2. Anxiety and depression ? ?- sertraline (ZOLOFT) 100 MG tablet; Take 1 tablet (100 mg total) by mouth daily.  Dispense: 90 tablet; Refill: 3 ?- busPIRone (BUSPAR) 15 MG tablet; Take 1 tablet (15 mg total) by mouth 2 (two) times daily.  Dispense: 180 tablet; Refill: 3 ? ?3. Insomnia, unspecified type ? ?- traZODone (DESYREL) 50 MG tablet; Take 0.5-1 tablets (25-50 mg total) by mouth at bedtime as needed for sleep.  Dispense: 30 tablet; Refill: 11 ? ?4. Hypothyroidism, unspecified type ? ?- levothyroxine (SYNTHROID) 50 MCG tablet; Take 1 tablet (50 mcg total) by mouth daily before breakfast.  Dispense: 90 tablet; Refill: 3 ?- TSH ? ?5. Chronic right-sided low back pain with right-sided sciatica ? ?- naproxen (NAPROSYN) 500 MG tablet; Take 1 tablet (500 mg total) by mouth 2 (two) times daily as needed.  Dispense: 60 tablet; Refill: 11 ? ?6. Vitamin D deficiency ? ?- Vitamin D, 25-hydroxy ? ?7. Status post dental extraction ? ?Please follow with oral surgeon ? ? ?Follow up: ? ?Follow up in 6 months or sooner if needed ? ?

## 2021-10-15 LAB — CBC
Hematocrit: 43.2 % (ref 37.5–51.0)
Hemoglobin: 13.8 g/dL (ref 13.0–17.7)
MCH: 26.5 pg — ABNORMAL LOW (ref 26.6–33.0)
MCHC: 31.9 g/dL (ref 31.5–35.7)
MCV: 83 fL (ref 79–97)
Platelets: 168 10*3/uL (ref 150–450)
RBC: 5.21 x10E6/uL (ref 4.14–5.80)
RDW: 12.2 % (ref 11.6–15.4)
WBC: 5.2 10*3/uL (ref 3.4–10.8)

## 2021-10-15 LAB — COMPREHENSIVE METABOLIC PANEL
ALT: 25 IU/L (ref 0–44)
AST: 31 IU/L (ref 0–40)
Albumin/Globulin Ratio: 1.2 (ref 1.2–2.2)
Albumin: 4.3 g/dL (ref 3.8–4.8)
Alkaline Phosphatase: 113 IU/L (ref 44–121)
BUN/Creatinine Ratio: 14 (ref 10–24)
BUN: 14 mg/dL (ref 8–27)
Bilirubin Total: 0.4 mg/dL (ref 0.0–1.2)
CO2: 24 mmol/L (ref 20–29)
Calcium: 9 mg/dL (ref 8.6–10.2)
Chloride: 105 mmol/L (ref 96–106)
Creatinine, Ser: 1 mg/dL (ref 0.76–1.27)
Globulin, Total: 3.5 g/dL (ref 1.5–4.5)
Glucose: 87 mg/dL (ref 70–99)
Potassium: 4.7 mmol/L (ref 3.5–5.2)
Sodium: 143 mmol/L (ref 134–144)
Total Protein: 7.8 g/dL (ref 6.0–8.5)
eGFR: 82 mL/min/{1.73_m2} (ref >59)

## 2021-10-15 LAB — VITAMIN D 25 HYDROXY (VIT D DEFICIENCY, FRACTURES): Vit D, 25-Hydroxy: 29.2 ng/mL — ABNORMAL LOW (ref 30.0–100.0)

## 2021-10-15 LAB — TSH: TSH: 2.82 u[IU]/mL (ref 0.450–4.500)

## 2022-01-11 ENCOUNTER — Encounter (HOSPITAL_COMMUNITY): Payer: Self-pay | Admitting: *Deleted

## 2022-01-11 ENCOUNTER — Other Ambulatory Visit: Payer: Self-pay

## 2022-01-11 ENCOUNTER — Ambulatory Visit (HOSPITAL_COMMUNITY)
Admission: EM | Admit: 2022-01-11 | Discharge: 2022-01-11 | Disposition: A | Discharge status: Home / Self Care | Payer: Medicare Other | Attending: Family Medicine | Admitting: Family Medicine

## 2022-01-11 DIAGNOSIS — M79672 Pain in left foot: Secondary | ICD-10-CM

## 2022-01-11 LAB — CBC
HCT: 42.5 % (ref 39.0–52.0)
Hemoglobin: 14 g/dL (ref 13.0–17.0)
MCH: 26.6 pg (ref 26.0–34.0)
MCHC: 32.9 g/dL (ref 30.0–36.0)
MCV: 80.8 fL (ref 80.0–100.0)
Platelets: 129 10*3/uL — ABNORMAL LOW (ref 150–400)
RBC: 5.26 MIL/uL (ref 4.22–5.81)
RDW: 12.7 % (ref 11.5–15.5)
WBC: 6.2 10*3/uL (ref 4.0–10.5)
nRBC: 0 % (ref 0.0–0.2)

## 2022-01-11 LAB — URIC ACID: Uric Acid, Serum: 7.9 mg/dL (ref 3.7–8.6)

## 2022-01-11 LAB — BASIC METABOLIC PANEL
Anion gap: 8 (ref 5–15)
BUN: 12 mg/dL (ref 8–23)
CO2: 24 mmol/L (ref 22–32)
Calcium: 9.1 mg/dL (ref 8.9–10.3)
Chloride: 108 mmol/L (ref 98–111)
Creatinine, Ser: 1.04 mg/dL (ref 0.61–1.24)
GFR, Estimated: >60 mL/min (ref >60)
Glucose, Bld: 93 mg/dL (ref 70–99)
Potassium: 3.9 mmol/L (ref 3.5–5.1)
Sodium: 140 mmol/L (ref 135–145)

## 2022-01-11 MED ORDER — COLCHICINE 0.6 MG PO TABS: 0.6000 mg | ORAL_TABLET | Freq: Every day | ORAL | 0 refills | Status: DC | PRN

## 2022-01-11 MED ORDER — PREDNISONE 20 MG PO TABS: 40.0000 mg | ORAL_TABLET | Freq: Every day | ORAL | 0 refills | Status: AC

## 2022-01-11 MED ORDER — AMOXICILLIN-POT CLAVULANATE 875-125 MG PO TABS: 1.0000 | ORAL_TABLET | Freq: Two times a day (BID) | ORAL | 0 refills | Status: AC

## 2022-01-11 NOTE — ED Provider Notes (Signed)
[place]    CSN: [id number] Arrival date & time: 01/11/22  1356      History   Chief Complaint Chief Complaint  Patient presents with   Foot Pain   Fever    HPI Shawn Casey is a 66 y.o. male.    Foot Pain  Fever  Here with left foot pain and swelling that began last night.  The family who is interpreting notes that he had a history of subjective fever, but they did not check it with a thermometer.  His temperature is normal here today.  No known injury or trauma.  He has never had gout in the past  He does not have a history of diabetes, and his sugar and A1c are normal in epic when they have been checked.  Past Medical History:  Diagnosis Date   Anxiety and depression    Chronic low back pain with right-sided sciatica    Hyperglycemia    Hyperlipidemia 02/2020   Hypertension    Hypothyroidism    Insomnia    Memory loss    Thrombocytopenia ([place]) 02/2020    Patient Active Problem List   Diagnosis Date Noted   Chronic post-traumatic stress disorder (PTSD) 05/10/2019   Muscle spasm of right shoulder 01/29/2019   Chronic right-sided low back pain with right-sided sciatica 01/29/2019   Essential hypertension 01/29/2019   Vitamin D deficiency 01/29/2019   Anxiety and depression 01/29/2019   Impaired mobility 01/29/2019   Language barrier 01/29/2019   Cognitive impairment 01/29/2019   PTSD (post-traumatic stress disorder) 02/23/2018   Memory loss    Hypothyroidism    Chest pain 02/23/2014   Urticaria 02/23/2014    History reviewed. No pertinent surgical history.     Home Medications    Prior to Admission medications   Medication Sig Start Date End Date Taking? Authorizing Provider  amoxicillin-clavulanate (AUGMENTIN) 875-125 MG tablet Take 1 tablet by mouth 2 (two) times daily for 7 days. 01/11/22 01/18/22 Yes Eshan Trupiano, Gwenlyn Perking, MD  colchicine 0.6 MG tablet Take 1 tablet (0.6 mg total) by mouth daily as needed (gout pain). 01/11/22   Yes Barrett Henle, MD  predniSONE (DELTASONE) 20 MG tablet Take 2 tablets (40 mg total) by mouth daily with breakfast for 5 days. 01/11/22 01/16/22 Yes Brice Potteiger, Gwenlyn Perking, MD  busPIRone (BUSPAR) 15 MG tablet Take 1 tablet (15 mg total) by mouth 2 (two) times daily. 10/14/21   Fenton Foy, NP  levothyroxine (SYNTHROID) 50 MCG tablet Take 1 tablet (50 mcg total) by mouth daily before breakfast. 10/14/21   Fenton Foy, NP  lisinopril (ZESTRIL) 20 MG tablet Take 1 tablet (20 mg total) by mouth daily. 10/14/21   Fenton Foy, NP  naproxen (NAPROSYN) 500 MG tablet Take 1 tablet (500 mg total) by mouth 2 (two) times daily as needed. 10/14/21   Fenton Foy, NP  sertraline (ZOLOFT) 100 MG tablet Take 1 tablet (100 mg total) by mouth daily. 10/14/21   Fenton Foy, NP  traZODone (DESYREL) 50 MG tablet Take 0.5-1 tablets (25-50 mg total) by mouth at bedtime as needed for sleep. 10/14/21   Fenton Foy, NP  Vitamin D, Ergocalciferol, (DRISDOL) 1.25 MG (50000 UNIT) CAPS capsule Take 1 capsule (50,000 Units total) by mouth once a week. 11/29/20   Vevelyn Francois, NP    Family History Family History  Problem Relation Age of Onset   CAD Neg Hx     Social History Social History  Tobacco Use   Smoking status: Never   Smokeless tobacco: Never  Vaping Use   Vaping Use: Never used  Substance Use Topics   Alcohol use: No   Drug use: No     Allergies   Patient has no known allergies.   Review of Systems Review of Systems  Constitutional:  Positive for fever.     Physical Exam Triage Vital Signs ED Triage Vitals  Enc Vitals Group     BP 01/11/22 1521 (!) 143/90     Pulse Rate 01/11/22 1521 85     Resp 01/11/22 1521 18     Temp 01/11/22 1521 99.6 F (37.6 C)     Temp src --      SpO2 01/11/22 1521 99 %     Weight --      Height --      Head Circumference --      Peak Flow --      Pain Score 01/11/22 1519 10     Pain Loc --      Pain Edu? --      Excl. in [place]? --     No data found.  Updated Vital Signs BP (!) 143/90   Pulse 85   Temp 99.6 F (37.6 C)   Resp 18   SpO2 99%   Visual Acuity Right Eye Distance:   Left Eye Distance:   Bilateral Distance:    Right Eye Near:   Left Eye Near:    Bilateral Near:     Physical Exam Vitals reviewed.  Constitutional:      General: He is not in acute distress.    Appearance: He is not ill-appearing, toxic-appearing or diaphoretic.  Cardiovascular:     Rate and Rhythm: Normal rate and regular rhythm.  Pulmonary:     Effort: Pulmonary effort is normal.     Breath sounds: Normal breath sounds.  Musculoskeletal:     Comments: There is mild swelling and some tenderness on either side of his left ankle.  Pulses are 2+ and equal bilaterally.  There is no bogginess or fluctuance.  Skin:    Coloration: Skin is not jaundiced or pale.  Neurological:     General: No focal deficit present.     Mental Status: He is alert and oriented to person, place, and time.  Psychiatric:        Behavior: Behavior normal.      UC Treatments / Results  Labs (all labs ordered are listed, but only abnormal results are displayed) Labs Reviewed  CBC  BASIC METABOLIC PANEL  URIC ACID    EKG   Radiology No results found.  Procedures Procedures (including critical care time)  Medications Ordered in UC Medications - No data to display  Initial Impression / Assessment and Plan / UC Course  I have reviewed the triage vital signs and the nursing notes.  Pertinent labs & imaging results that were available during my care of the patient were reviewed by me and considered in my medical decision making (see chart for details).     I do feel this is most consistent with probable gout attack.  When I discussed this with the family, it is met with some skepticism.  Therefore I am going to send in Augmentin for possible cellulitis, and also prednisone and colchicine as needed for gout.  I have asked him to follow-up  with her PCP.  We will do lab work and let them know if there is anything  significantly abnormal Final Clinical Impressions(s) / UC Diagnoses   Final diagnoses:  Left foot pain     Discharge Instructions      Take amoxicillin-clavulanate 875 mg--1 tab twice daily with food for 7 days.  This is an antibiotic for possible infection in the tissues  Prednisone 20 mg--take 2 daily for 5 days.  This is for inflammation and will help gout pain significantly  Colchicine 0.6 mg--1 daily as needed for gout pain  In addition to these medications she can also take Tylenol 500 mg--2 every 6 hours as needed for pain or fever  We have drawn blood work to check your blood counts, uric acid, and kidney function and electrolytes.  Our staff will call you if anything is significantly abnormal.  Lease follow-up with your primary care next week     ED Prescriptions     Medication Sig Dispense Auth. Provider   amoxicillin-clavulanate (AUGMENTIN) 875-125 MG tablet Take 1 tablet by mouth 2 (two) times daily for 7 days. 14 tablet Vira Chaplin, Gwenlyn Perking, MD   predniSONE (DELTASONE) 20 MG tablet Take 2 tablets (40 mg total) by mouth daily with breakfast for 5 days. 10 tablet Barrett Henle, MD   colchicine 0.6 MG tablet Take 1 tablet (0.6 mg total) by mouth daily as needed (gout pain). 15 tablet Madelynn Malson, Gwenlyn Perking, MD      PDMP not reviewed this encounter.   Barrett Henle, MD 01/11/22 [id number]

## 2022-01-11 NOTE — ED Triage Notes (Signed)
Pt reports fever and sore foot started last night. Lt foot swollen ans swollen.

## 2022-01-11 NOTE — Discharge Instructions (Addendum)
Take amoxicillin-clavulanate 875 mg--1 tab twice daily with food for 7 days.  This is an antibiotic for possible infection in the tissues  Prednisone 20 mg--take 2 daily for 5 days.  This is for inflammation and will help gout pain significantly  Colchicine 0.6 mg--1 daily as needed for gout pain  In addition to these medications she can also take Tylenol 500 mg--2 every 6 hours as needed for pain or fever  We have drawn blood work to check your blood counts, uric acid, and kidney function and electrolytes.  Our staff will call you if anything is significantly abnormal.  Lease follow-up with your primary care next week

## 2022-04-16 ENCOUNTER — Ambulatory Visit (INDEPENDENT_AMBULATORY_CARE_PROVIDER_SITE_OTHER): Payer: Medicare Other | Admitting: Nurse Practitioner

## 2022-04-16 ENCOUNTER — Encounter: Payer: Self-pay | Admitting: Nurse Practitioner

## 2022-04-16 VITALS — BP 154/82 | HR 60 | Temp 97.7°F | Ht 60.0 in | Wt 123.0 lb

## 2022-04-16 DIAGNOSIS — E039 Hypothyroidism, unspecified: Secondary | ICD-10-CM | POA: Diagnosis not present

## 2022-04-16 DIAGNOSIS — Z13220 Encounter for screening for lipoid disorders: Secondary | ICD-10-CM | POA: Diagnosis not present

## 2022-04-16 DIAGNOSIS — I10 Essential (primary) hypertension: Secondary | ICD-10-CM

## 2022-04-16 MED ORDER — HYDROCHLOROTHIAZIDE 12.5 MG PO CAPS: 12.5000 mg | ORAL_CAPSULE | Freq: Every day | ORAL | 2 refills | Status: DC

## 2022-04-16 NOTE — Patient Instructions (Signed)
1. Essential hypertension  - hydrochlorothiazide (MICROZIDE) 12.5 MG capsule; Take 1 capsule (12.5 mg total) by mouth daily.  Dispense: 30 capsule; Refill: 2 - AMB Referral to Pharmacy Medication Management - CBC - Comprehensive metabolic panel - Lipid Panel - Thyroid Panel With TSH  Please follow low salt diet  2. Acquired hypothyroidism  - Thyroid Panel With TSH  Follow up:  Follow up in 6 months or sooner if needed

## 2022-04-16 NOTE — Progress Notes (Signed)
@Patient  ID: , male    DOB: 12/10/1955, 66 y.o.   MRN: [id number]  Chief Complaint  Patient presents with   Hypertension    Referring provider: [id number], NP   HPI  Shawn Casey [facility] presents for follow up. He  has a past medical history of Anxiety and depression, Chronic low back pain with right-sided sciatica, Hyperglycemia, Hyperlipidemia (02/2020), Hypertension, Hypothyroidism, Insomnia, Memory loss, and Thrombocytopenia (HCC) (02/2020).    Patient presents today for follow-up on hypertension and thyroid.  Overall he has been doing well with no new issues or concerns.  His son does report that they check his blood pressure at home and it has been elevated.  It was elevated in office today.  We will add HCTZ low-dose today we will consult pharmacy for follow-up.  Patient admits that he does drink water throughout the day.  He drinks hot water with salt added into it.  We discussed that this is not good for his blood pressure. Denies f/c/s, n/v/d, hemoptysis, PND, leg swelling Denies chest pain or edema       No Known Allergies  Immunization History  Administered Date(s) Administered   Influenza,inj,Quad PF,6+ Mos 04/15/2021   PFIZER(Purple Top)SARS-COV-2 Vaccination 08/25/2019, 09/13/2019    Past Medical History:  Diagnosis Date   Anxiety and depression    Chronic low back pain with right-sided sciatica    Hyperglycemia    Hyperlipidemia 02/2020   Hypertension    Hypothyroidism    Insomnia    Memory loss    Thrombocytopenia (HCC) 02/2020    Tobacco History: Social History   Tobacco Use  Smoking Status Never  Smokeless Tobacco Never   Counseling given: Not Answered   Outpatient Encounter Medications as of 04/16/2022  Medication Sig   busPIRone (BUSPAR) 15 MG tablet Take 1 tablet (15 mg total) by mouth 2 (two) times daily.   colchicine 0.6 MG tablet Take 1 tablet (0.6 mg total) by mouth daily as needed (gout pain).    hydrochlorothiazide (MICROZIDE) 12.5 MG capsule Take 1 capsule (12.5 mg total) by mouth daily.   levothyroxine (SYNTHROID) 50 MCG tablet Take 1 tablet (50 mcg total) by mouth daily before breakfast.   lisinopril (ZESTRIL) 20 MG tablet Take 1 tablet (20 mg total) by mouth daily.   naproxen (NAPROSYN) 500 MG tablet Take 1 tablet (500 mg total) by mouth 2 (two) times daily as needed.   sertraline (ZOLOFT) 100 MG tablet Take 1 tablet (100 mg total) by mouth daily.   traZODone (DESYREL) 50 MG tablet Take 0.5-1 tablets (25-50 mg total) by mouth at bedtime as needed for sleep.   [DISCONTINUED] Vitamin D, Ergocalciferol, (DRISDOL) 1.25 MG (50000 UNIT) CAPS capsule Take 1 capsule (50,000 Units total) by mouth once a week.   No facility-administered encounter medications on file as of 04/16/2022.     Review of Systems  Review of Systems  Constitutional: Negative.   HENT: Negative.    Cardiovascular: Negative.   Gastrointestinal: Negative.   Allergic/Immunologic: Negative.   Neurological: Negative.   Psychiatric/Behavioral: Negative.         Physical Exam  BP (!) 154/82   Pulse 60   Temp 97.7 F (36.5 C)   Ht 5' (1.524 m)   Wt 123 lb (55.8 kg)   SpO2 98%   BMI 24.02 kg/m   Wt Readings from Last 5 Encounters:  04/16/22 123 lb (55.8 kg)  04/15/21 120 lb 12.8 oz (54.8 kg)  02/10/20 128 lb (  58.1 kg)  01/25/19 128 lb (58.1 kg)  02/23/18 129 lb 12.8 oz (58.9 kg)     Physical Exam Vitals and nursing note reviewed.  Constitutional:      General: He is not in acute distress.    Appearance: He is well-developed.  Cardiovascular:     Rate and Rhythm: Normal rate and regular rhythm.  Pulmonary:     Effort: Pulmonary effort is normal.     Breath sounds: Normal breath sounds.  Skin:    General: Skin is warm and dry.  Neurological:     Mental Status: He is alert and oriented to person, place, and time.      Lab Results:  CBC    Component Value Date/Time   WBC 6.2 01/11/2022  1541   RBC 5.26 01/11/2022 1541   HGB 14.0 01/11/2022 1541   HGB 13.8 10/14/2021 1129   HCT 42.5 01/11/2022 1541   HCT 43.2 10/14/2021 1129   PLT 129 (L) 01/11/2022 1541   PLT 168 10/14/2021 1129   MCV 80.8 01/11/2022 1541   MCV 83 10/14/2021 1129   MCH 26.6 01/11/2022 1541   MCHC 32.9 01/11/2022 1541   RDW 12.7 01/11/2022 1541   RDW 12.2 10/14/2021 1129   LYMPHSABS 1.3 02/10/2020 1218   MONOABS 0.2 02/23/2014 1237   EOSABS 0.2 02/10/2020 1218   BASOSABS 0.0 02/10/2020 1218    BMET    Component Value Date/Time   NA 140 01/11/2022 1541   NA 143 10/14/2021 1129   K 3.9 01/11/2022 1541   CL 108 01/11/2022 1541   CO2 24 01/11/2022 1541   GLUCOSE 93 01/11/2022 1541   BUN 12 01/11/2022 1541   BUN 14 10/14/2021 1129   CREATININE 1.04 01/11/2022 1541   CALCIUM 9.1 01/11/2022 1541   GFRNONAA >60 01/11/2022 1541   GFRAA 86 02/10/2020 1218    BNP No results found for: "BNP"  ProBNP No results found for: "PROBNP"  Imaging: No results found.   Assessment & Plan:   Essential hypertension - hydrochlorothiazide (MICROZIDE) 12.5 MG capsule; Take 1 capsule (12.5 mg total) by mouth daily.  Dispense: 30 capsule; Refill: 2 - AMB Referral to Pharmacy Medication Management - CBC - Comprehensive metabolic panel - Lipid Panel - Thyroid Panel With TSH  Please follow low salt diet  2. Acquired hypothyroidism  - Thyroid Panel With TSH  Follow up:  Follow up in 6 months or sooner if needed     Ivonne Andrew, NP 04/16/2022

## 2022-04-16 NOTE — Assessment & Plan Note (Signed)
-   hydrochlorothiazide (MICROZIDE) 12.5 MG capsule; Take 1 capsule (12.5 mg total) by mouth daily.  Dispense: 30 capsule; Refill: 2 - AMB Referral to Pharmacy Medication Management - CBC - Comprehensive metabolic panel - Lipid Panel - Thyroid Panel With TSH  Please follow low salt diet  2. Acquired hypothyroidism  - Thyroid Panel With TSH  Follow up:  Follow up in 6 months or sooner if needed

## 2022-04-17 LAB — LIPID PANEL
Chol/HDL Ratio: 6.5 ratio — ABNORMAL HIGH (ref 0.0–5.0)
Cholesterol, Total: 189 mg/dL (ref 100–199)
HDL: 29 mg/dL — ABNORMAL LOW (ref >39)
LDL Chol Calc (NIH): 125 mg/dL — ABNORMAL HIGH (ref 0–99)
Triglycerides: 195 mg/dL — ABNORMAL HIGH (ref 0–149)
VLDL Cholesterol Cal: 35 mg/dL (ref 5–40)

## 2022-04-17 LAB — THYROID PANEL WITH TSH
Free Thyroxine Index: 1.7 (ref 1.2–4.9)
T3 Uptake Ratio: 28 % (ref 24–39)
T4, Total: 6 ug/dL (ref 4.5–12.0)
TSH: 2.85 u[IU]/mL (ref 0.450–4.500)

## 2022-04-17 LAB — CBC
Hematocrit: 42.6 % (ref 37.5–51.0)
Hemoglobin: 13.6 g/dL (ref 13.0–17.7)
MCH: 26.9 pg (ref 26.6–33.0)
MCHC: 31.9 g/dL (ref 31.5–35.7)
MCV: 84 fL (ref 79–97)
Platelets: 156 10*3/uL (ref 150–450)
RBC: 5.06 x10E6/uL (ref 4.14–5.80)
RDW: 12.4 % (ref 11.6–15.4)
WBC: 5 10*3/uL (ref 3.4–10.8)

## 2022-04-17 LAB — COMPREHENSIVE METABOLIC PANEL
ALT: 34 IU/L (ref 0–44)
AST: 35 IU/L (ref 0–40)
Albumin/Globulin Ratio: 1.3 (ref 1.2–2.2)
Albumin: 4.2 g/dL (ref 3.9–4.9)
Alkaline Phosphatase: 88 IU/L (ref 44–121)
BUN/Creatinine Ratio: 12 (ref 10–24)
BUN: 13 mg/dL (ref 8–27)
Bilirubin Total: 0.5 mg/dL (ref 0.0–1.2)
CO2: 23 mmol/L (ref 20–29)
Calcium: 9.3 mg/dL (ref 8.6–10.2)
Chloride: 106 mmol/L (ref 96–106)
Creatinine, Ser: 1.08 mg/dL (ref 0.76–1.27)
Globulin, Total: 3.2 g/dL (ref 1.5–4.5)
Glucose: 78 mg/dL (ref 70–99)
Potassium: 4.8 mmol/L (ref 3.5–5.2)
Sodium: 141 mmol/L (ref 134–144)
Total Protein: 7.4 g/dL (ref 6.0–8.5)
eGFR: 75 mL/min/{1.73_m2} (ref >59)

## 2022-04-18 ENCOUNTER — Telehealth: Payer: Self-pay

## 2022-04-18 NOTE — Progress Notes (Signed)
   Care Guide Note  04/18/2022 Name: Shawn Casey MRN: [id number] DOB: 06/05/1956  Referred by: Ivonne Andrew, NP Reason for referral : Care Coordination (Outreach to schedule with Pharm D )   Shawn Casey is a 66 y.o. year old male who is a primary care patient of Ivonne Andrew, NP. Shawn Casey was referred to the pharmacist for assistance related to DM.    An unsuccessful telephone outreach was attempted today to contact the patient who was referred to the pharmacy team for assistance with medication management. Additional attempts will be made to contact the patient.   Penne Lash, RMA Care Guide [facility] [place], [place] [address] Direct Dial: [phone] [email or web address]

## 2022-04-25 ENCOUNTER — Encounter: Payer: Self-pay | Admitting: *Deleted

## 2022-04-28 NOTE — Progress Notes (Signed)
   Care Guide Note  04/28/2022 Name: Shawn Casey MRN: [id number] DOB: 05-29-1956  Referred by: Ivonne Andrew, NP Reason for referral : Care Coordination (Outreach to schedule with Pharm D )   [name] is a 66 y.o. year old male who is a primary care patient of Ivonne Andrew, NP. Akeen Ledyard was referred to the pharmacist for assistance related to DM.    A second unsuccessful telephone outreach was attempted today to contact the patient who was referred to the pharmacy team for assistance with medication management. Additional attempts will be made to contact the patient.  Penne Lash, RMA Care Guide [facility]  [place], [place] [address] Direct Dial: [phone] [email or web address]

## 2022-05-08 NOTE — Progress Notes (Signed)
   Care Guide Note  05/08/2022 Name: Shawn Casey MRN: [id number] DOB: 1955/07/10  Referred by: Ivonne Andrew, NP Reason for referral : Care Coordination (Outreach to schedule with Pharm D )   Shawn Casey is a 66 y.o. year old male who is a primary care patient of Ivonne Andrew, NP. Shawn Casey was referred to the pharmacist for assistance related to HTN and DM.    Successful contact was made with the patient to discuss pharmacy services. Patient's daughter declines engagement at this time. Contact information was provided to the patient should they wish to reach out for assistance at a later time.  Penne Lash, RMA Care Guide [facility]  [place], [place] [address] Direct Dial: [phone] [email or web address]

## 2022-07-03 ENCOUNTER — Other Ambulatory Visit: Payer: Self-pay | Admitting: Nurse Practitioner

## 2022-07-03 DIAGNOSIS — I10 Essential (primary) hypertension: Secondary | ICD-10-CM

## 2022-07-04 ENCOUNTER — Other Ambulatory Visit: Payer: Self-pay

## 2022-07-04 DIAGNOSIS — I10 Essential (primary) hypertension: Secondary | ICD-10-CM

## 2022-07-04 MED ORDER — HYDROCHLOROTHIAZIDE 12.5 MG PO CAPS: 12.5000 mg | ORAL_CAPSULE | Freq: Every day | ORAL | 0 refills | Status: DC

## 2022-09-13 ENCOUNTER — Other Ambulatory Visit: Payer: Self-pay | Admitting: Nurse Practitioner

## 2022-09-13 DIAGNOSIS — G8929 Other chronic pain: Secondary | ICD-10-CM

## 2022-09-19 ENCOUNTER — Encounter: Payer: Self-pay | Admitting: Nurse Practitioner

## 2022-09-19 ENCOUNTER — Ambulatory Visit (HOSPITAL_COMMUNITY)
Admission: RE | Admit: 2022-09-19 | Discharge: 2022-09-19 | Disposition: A | Discharge status: Home / Self Care | Payer: Medicare Other | Source: Ambulatory Visit | Attending: Nurse Practitioner | Admitting: Nurse Practitioner

## 2022-09-19 ENCOUNTER — Ambulatory Visit (INDEPENDENT_AMBULATORY_CARE_PROVIDER_SITE_OTHER): Payer: Medicare Other | Admitting: Nurse Practitioner

## 2022-09-19 VITALS — BP 96/62 | HR 88 | Temp 98.4°F | Ht 60.0 in | Wt 112.8 lb

## 2022-09-19 DIAGNOSIS — J069 Acute upper respiratory infection, unspecified: Secondary | ICD-10-CM

## 2022-09-19 DIAGNOSIS — Z1211 Encounter for screening for malignant neoplasm of colon: Secondary | ICD-10-CM

## 2022-09-19 DIAGNOSIS — R319 Hematuria, unspecified: Secondary | ICD-10-CM

## 2022-09-19 LAB — POCT INFLUENZA A/B
Influenza A, POC: NEGATIVE
Influenza B, POC: NEGATIVE

## 2022-09-19 LAB — POCT URINALYSIS DIP (PROADVANTAGE DEVICE)
Bilirubin, UA: NEGATIVE
Glucose, UA: NEGATIVE mg/dL
Ketones, POC UA: NEGATIVE mg/dL
Leukocytes, UA: NEGATIVE
Nitrite, UA: NEGATIVE
Specific Gravity, Urine: 1.02
Urobilinogen, Ur: 0.2
pH, UA: 5 (ref 5.0–8.0)

## 2022-09-19 LAB — POC COVID19 BINAXNOW: SARS Coronavirus 2 Ag: NEGATIVE

## 2022-09-19 MED ORDER — BENZONATATE 200 MG PO CAPS: 200.0000 mg | ORAL_CAPSULE | Freq: Two times a day (BID) | ORAL | 0 refills | Status: DC | PRN

## 2022-09-19 MED ORDER — PREDNISONE 20 MG PO TABS: 20.0000 mg | ORAL_TABLET | Freq: Every day | ORAL | 0 refills | Status: AC

## 2022-09-19 MED ORDER — AMOXICILLIN-POT CLAVULANATE 875-125 MG PO TABS: 1.0000 | ORAL_TABLET | Freq: Two times a day (BID) | ORAL | 0 refills | Status: DC

## 2022-09-19 NOTE — Patient Instructions (Signed)
1. Upper respiratory tract infection, unspecified type  - DG Chest 2 View - amoxicillin-clavulanate (AUGMENTIN) 875-125 MG tablet; Take 1 tablet by mouth 2 (two) times daily.  Dispense: 20 tablet; Refill: 0 - benzonatate (TESSALON) 200 MG capsule; Take 1 capsule (200 mg total) by mouth 2 (two) times daily as needed for cough.  Dispense: 20 capsule; Refill: 0 - predniSONE (DELTASONE) 20 MG tablet; Take 1 tablet (20 mg total) by mouth daily with breakfast for 5 days.  Dispense: 5 tablet; Refill: 0  2. Colon cancer screening  - Cologuard    Follow up;  Follow up in 3 months

## 2022-09-19 NOTE — Assessment & Plan Note (Signed)
-   DG Chest 2 View - amoxicillin-clavulanate (AUGMENTIN) 875-125 MG tablet; Take 1 tablet by mouth 2 (two) times daily.  Dispense: 20 tablet; Refill: 0 - benzonatate (TESSALON) 200 MG capsule; Take 1 capsule (200 mg total) by mouth 2 (two) times daily as needed for cough.  Dispense: 20 capsule; Refill: 0 - predniSONE (DELTASONE) 20 MG tablet; Take 1 tablet (20 mg total) by mouth daily with breakfast for 5 days.  Dispense: 5 tablet; Refill: 0  2. Colon cancer screening  - Cologuard    Follow up;  Follow up in 3 months

## 2022-09-19 NOTE — Progress Notes (Signed)
@Patient  ID: Shawn Casey, male    DOB: 1956/05/13, 67 y.o.   MRN: [id number]  Chief Complaint  Patient presents with   Cough   Pain    In joints and back    Referring provider: Ivonne Andrew, NP   HPI  [facility] presents for follow up. He  has a past medical history of Anxiety and depression, Chronic low back pain with right-sided sciatica, Hyperglycemia, Hyperlipidemia (02/2020), Hypertension, Hypothyroidism, Insomnia, Memory loss, and Thrombocytopenia (HCC) (02/2020).    Patient presents today for sick visit.  He states that for the past week he has been having cough mainly at night, fever, nasal congestion with postnasal drip.  He has also been having generalized body aches. Denies f/c/s, n/v/d, hemoptysis, PND, leg swelling Denies chest pain or edema     No Known Allergies  Immunization History  Administered Date(s) Administered   Influenza,inj,Quad PF,6+ Mos 04/15/2021   PFIZER(Purple Top)SARS-COV-2 Vaccination 08/25/2019, 09/13/2019    Past Medical History:  Diagnosis Date   Anxiety and depression    Chronic low back pain with right-sided sciatica    Hyperglycemia    Hyperlipidemia 02/2020   Hypertension    Hypothyroidism    Insomnia    Memory loss    Thrombocytopenia 02/2020    Tobacco History: Social History   Tobacco Use  Smoking Status Never  Smokeless Tobacco Never   Counseling given: Not Answered   Outpatient Encounter Medications as of 09/19/2022  Medication Sig   amoxicillin-clavulanate (AUGMENTIN) 875-125 MG tablet Take 1 tablet by mouth 2 (two) times daily.   benzonatate (TESSALON) 200 MG capsule Take 1 capsule (200 mg total) by mouth 2 (two) times daily as needed for cough.   busPIRone (BUSPAR) 15 MG tablet Take 1 tablet (15 mg total) by mouth 2 (two) times daily.   colchicine 0.6 MG tablet Take 1 tablet (0.6 mg total) by mouth daily as needed (gout pain).   hydrochlorothiazide (MICROZIDE) 12.5 MG capsule Take  1 capsule (12.5 mg total) by mouth daily.   levothyroxine (SYNTHROID) 50 MCG tablet Take 1 tablet (50 mcg total) by mouth daily before breakfast.   lisinopril (ZESTRIL) 20 MG tablet Take 1 tablet (20 mg total) by mouth daily.   naproxen (NAPROSYN) 500 MG tablet Take 1 tablet by mouth twice daily as needed   predniSONE (DELTASONE) 20 MG tablet Take 1 tablet (20 mg total) by mouth daily with breakfast for 5 days.   sertraline (ZOLOFT) 100 MG tablet Take 1 tablet (100 mg total) by mouth daily.   traZODone (DESYREL) 50 MG tablet Take 0.5-1 tablets (25-50 mg total) by mouth at bedtime as needed for sleep.   No facility-administered encounter medications on file as of 09/19/2022.     Review of Systems  Review of Systems  Constitutional: Negative.   HENT:  Positive for congestion, postnasal drip, sinus pressure and sinus pain.   Respiratory:  Positive for cough.   Cardiovascular: Negative.   Gastrointestinal: Negative.   Allergic/Immunologic: Negative.   Neurological: Negative.   Psychiatric/Behavioral: Negative.         Physical Exam  BP 96/62   Pulse 88   Temp 98.4 F (36.9 C)   Ht 5' (1.524 m)   Wt 112 lb 12.8 oz (51.2 kg)   SpO2 96%   BMI 22.03 kg/m   Wt Readings from Last 5 Encounters:  09/19/22 112 lb 12.8 oz (51.2 kg)  04/16/22 123 lb (55.8 kg)  04/15/21 120 lb 12.8  oz (54.8 kg)  02/10/20 128 lb (58.1 kg)  01/25/19 128 lb (58.1 kg)     Physical Exam Vitals and nursing note reviewed.  Constitutional:      General: He is not in acute distress.    Appearance: He is well-developed.  HENT:     Nose: Congestion present.  Cardiovascular:     Rate and Rhythm: Normal rate and regular rhythm.  Pulmonary:     Effort: Pulmonary effort is normal.     Breath sounds: Rhonchi present.  Skin:    General: Skin is warm and dry.  Neurological:     Mental Status: He is alert and oriented to person, place, and time.      Lab Results:  CBC    Component Value Date/Time    WBC 5.0 04/16/2022 1106   WBC 6.2 01/11/2022 1541   RBC 5.06 04/16/2022 1106   RBC 5.26 01/11/2022 1541   HGB 13.6 04/16/2022 1106   HCT 42.6 04/16/2022 1106   PLT 156 04/16/2022 1106   MCV 84 04/16/2022 1106   MCH 26.9 04/16/2022 1106   MCH 26.6 01/11/2022 1541   MCHC 31.9 04/16/2022 1106   MCHC 32.9 01/11/2022 1541   RDW 12.4 04/16/2022 1106   LYMPHSABS 1.3 02/10/2020 1218   MONOABS 0.2 02/23/2014 1237   EOSABS 0.2 02/10/2020 1218   BASOSABS 0.0 02/10/2020 1218    BMET    Component Value Date/Time   NA 141 04/16/2022 1106   K 4.8 04/16/2022 1106   CL 106 04/16/2022 1106   CO2 23 04/16/2022 1106   GLUCOSE 78 04/16/2022 1106   GLUCOSE 93 01/11/2022 1541   BUN 13 04/16/2022 1106   CREATININE 1.08 04/16/2022 1106   CALCIUM 9.3 04/16/2022 1106   GFRNONAA >60 01/11/2022 1541   GFRAA 86 02/10/2020 1218      Assessment & Plan:   Upper respiratory tract infection - DG Chest 2 View - amoxicillin-clavulanate (AUGMENTIN) 875-125 MG tablet; Take 1 tablet by mouth 2 (two) times daily.  Dispense: 20 tablet; Refill: 0 - benzonatate (TESSALON) 200 MG capsule; Take 1 capsule (200 mg total) by mouth 2 (two) times daily as needed for cough.  Dispense: 20 capsule; Refill: 0 - predniSONE (DELTASONE) 20 MG tablet; Take 1 tablet (20 mg total) by mouth daily with breakfast for 5 days.  Dispense: 5 tablet; Refill: 0  2. Colon cancer screening  - Cologuard    Follow up;  Follow up in 3 months     Ivonne Andrew, NP 09/19/2022

## 2022-09-19 NOTE — Addendum Note (Signed)
Addended by: Renelda Loma on: 09/19/2022 01:34 PM   Modules accepted: Orders

## 2022-10-07 ENCOUNTER — Telehealth: Payer: Self-pay | Admitting: Nurse Practitioner

## 2022-10-07 LAB — COLOGUARD: COLOGUARD: NEGATIVE

## 2022-10-07 NOTE — Telephone Encounter (Signed)
Called patient to schedule Medicare Annual Wellness Visit (AWV). No voicemail available to leave a message. Abby, NHA.  Last date of AWV: due 03/09/2020 awvi per [facility]  Please schedule an appointment at any time with Abby, NHA.   If any questions, please contact me at [phone].  Thank you,  Judeth Cornfield,  AMB Clinical Support [facility] AWV Program Direct Dial ??[phone]

## 2022-10-14 ENCOUNTER — Other Ambulatory Visit: Payer: Self-pay | Admitting: Nurse Practitioner

## 2022-10-14 DIAGNOSIS — G8929 Other chronic pain: Secondary | ICD-10-CM

## 2022-10-14 NOTE — Telephone Encounter (Signed)
Please advise KH 

## 2022-10-15 ENCOUNTER — Ambulatory Visit (INDEPENDENT_AMBULATORY_CARE_PROVIDER_SITE_OTHER): Payer: Medicare Other | Admitting: Nurse Practitioner

## 2022-10-15 ENCOUNTER — Encounter: Payer: Self-pay | Admitting: Nurse Practitioner

## 2022-10-15 ENCOUNTER — Ambulatory Visit (HOSPITAL_COMMUNITY)
Admission: RE | Admit: 2022-10-15 | Discharge: 2022-10-15 | Disposition: A | Discharge status: Home / Self Care | Payer: Medicare Other | Source: Ambulatory Visit | Attending: Nurse Practitioner | Admitting: Nurse Practitioner

## 2022-10-15 VITALS — BP 134/67 | HR 61 | Temp 97.3°F | Wt 120.6 lb

## 2022-10-15 DIAGNOSIS — Z23 Encounter for immunization: Secondary | ICD-10-CM | POA: Diagnosis not present

## 2022-10-15 DIAGNOSIS — J302 Other seasonal allergic rhinitis: Secondary | ICD-10-CM

## 2022-10-15 DIAGNOSIS — Z8701 Personal history of pneumonia (recurrent): Secondary | ICD-10-CM | POA: Diagnosis not present

## 2022-10-15 DIAGNOSIS — E039 Hypothyroidism, unspecified: Secondary | ICD-10-CM

## 2022-10-15 MED ORDER — GUAIFENESIN ER 600 MG PO TB12: 600.0000 mg | ORAL_TABLET | Freq: Two times a day (BID) | ORAL | 0 refills | Status: AC

## 2022-10-15 MED ORDER — CETIRIZINE HCL 10 MG PO TABS: 10.0000 mg | ORAL_TABLET | Freq: Every day | ORAL | 11 refills | Status: AC

## 2022-10-15 NOTE — Progress Notes (Signed)
@Patient  ID: Shawn Casey, male    DOB: Jan 29, 1956, 67 y.o.   MRN: [id number]  Chief Complaint  Patient presents with   Hypertension    Follow up    Referring provider: Ivonne Andrew, NP   HPI  Shawn Casey [facility] presents for follow up. He  has a past medical history of Anxiety and depression, Chronic low back pain with right-sided sciatica, Hyperglycemia, Hyperlipidemia (02/2020), Hypertension, Hypothyroidism, Insomnia, Memory loss, and Thrombocytopenia (HCC) (02/2020).    Patient presents today for follow-up on hypertension and thyroid.  Overall he has been doing well with no new issues or concerns.  His son does report that they check his blood pressure at home and it has been normal.  Patient is compliant with medications.   Seen in this office around 1 month ago was diagnosed with pneumonia with her x-ray.  He did complete his course of antibiotics and steroids.  We will check a follow-up chest x-ray today.  Patient states that he is still having a lot of mucus in his throat.  We will order Mucinex and Zyrtec.    Denies f/c/s, n/v/d, hemoptysis, PND, leg swelling Denies chest pain or edema   No Known Allergies  Immunization History  Administered Date(s) Administered   Influenza,inj,Quad PF,6+ Mos 04/15/2021   PFIZER(Purple Top)SARS-COV-2 Vaccination 08/25/2019, 09/13/2019   Pneumococcal Polysaccharide-23 10/15/2022    Past Medical History:  Diagnosis Date   Anxiety and depression    Chronic low back pain with right-sided sciatica    Hyperglycemia    Hyperlipidemia 02/2020   Hypertension    Hypothyroidism    Insomnia    Memory loss    Thrombocytopenia (HCC) 02/2020    Tobacco History: Social History   Tobacco Use  Smoking Status Never  Smokeless Tobacco Never   Counseling given: Not Answered   Outpatient Encounter Medications as of 10/15/2022  Medication Sig   cetirizine (ZYRTEC) 10 MG tablet Take 1 tablet (10 mg total) by mouth daily.    guaiFENesin (MUCINEX) 600 MG 12 hr tablet Take 1 tablet (600 mg total) by mouth 2 (two) times daily for 7 days.   amoxicillin-clavulanate (AUGMENTIN) 875-125 MG tablet Take 1 tablet by mouth 2 (two) times daily.   benzonatate (TESSALON) 200 MG capsule Take 1 capsule (200 mg total) by mouth 2 (two) times daily as needed for cough.   busPIRone (BUSPAR) 15 MG tablet Take 1 tablet (15 mg total) by mouth 2 (two) times daily.   colchicine 0.6 MG tablet Take 1 tablet (0.6 mg total) by mouth daily as needed (gout pain).   hydrochlorothiazide (MICROZIDE) 12.5 MG capsule Take 1 capsule (12.5 mg total) by mouth daily.   levothyroxine (SYNTHROID) 50 MCG tablet Take 1 tablet (50 mcg total) by mouth daily before breakfast.   lisinopril (ZESTRIL) 20 MG tablet Take 1 tablet (20 mg total) by mouth daily.   naproxen (NAPROSYN) 500 MG tablet Take 1 tablet by mouth twice daily as needed   sertraline (ZOLOFT) 100 MG tablet Take 1 tablet (100 mg total) by mouth daily.   traZODone (DESYREL) 50 MG tablet Take 0.5-1 tablets (25-50 mg total) by mouth at bedtime as needed for sleep.   No facility-administered encounter medications on file as of 10/15/2022.     Review of Systems  Review of Systems  Constitutional: Negative.   HENT: Negative.    Cardiovascular: Negative.   Gastrointestinal: Negative.   Allergic/Immunologic: Negative.   Neurological: Negative.   Psychiatric/Behavioral: Negative.  Physical Exam  BP 134/67   Pulse 61   Temp (!) 97.3 F (36.3 C)   Wt 120 lb 9.6 oz (54.7 kg)   SpO2 100%   BMI 23.55 kg/m   Wt Readings from Last 5 Encounters:  10/15/22 120 lb 9.6 oz (54.7 kg)  09/19/22 112 lb 12.8 oz (51.2 kg)  04/16/22 123 lb (55.8 kg)  04/15/21 120 lb 12.8 oz (54.8 kg)  02/10/20 128 lb (58.1 kg)     Physical Exam Vitals and nursing note reviewed.  Constitutional:      General: He is not in acute distress.    Appearance: He is well-developed.  Cardiovascular:     Rate  and Rhythm: Normal rate and regular rhythm.  Pulmonary:     Effort: Pulmonary effort is normal.     Breath sounds: Normal breath sounds.     Comments: Crackles noted to right base Skin:    General: Skin is warm and dry.  Neurological:     Mental Status: He is alert and oriented to person, place, and time.      Lab Results:  CBC    Component Value Date/Time   WBC 5.0 04/16/2022 1106   WBC 6.2 01/11/2022 1541   RBC 5.06 04/16/2022 1106   RBC 5.26 01/11/2022 1541   HGB 13.6 04/16/2022 1106   HCT 42.6 04/16/2022 1106   PLT 156 04/16/2022 1106   MCV 84 04/16/2022 1106   MCH 26.9 04/16/2022 1106   MCH 26.6 01/11/2022 1541   MCHC 31.9 04/16/2022 1106   MCHC 32.9 01/11/2022 1541   RDW 12.4 04/16/2022 1106   LYMPHSABS 1.3 02/10/2020 1218   MONOABS 0.2 02/23/2014 1237   EOSABS 0.2 02/10/2020 1218   BASOSABS 0.0 02/10/2020 1218    BMET    Component Value Date/Time   NA 141 04/16/2022 1106   K 4.8 04/16/2022 1106   CL 106 04/16/2022 1106   CO2 23 04/16/2022 1106   GLUCOSE 78 04/16/2022 1106   GLUCOSE 93 01/11/2022 1541   BUN 13 04/16/2022 1106   CREATININE 1.08 04/16/2022 1106   CALCIUM 9.3 04/16/2022 1106   GFRNONAA >60 01/11/2022 1541   GFRAA 86 02/10/2020 1218    BNP No results found for: "BNP"  ProBNP No results found for: "PROBNP"  Imaging: DG Chest 2 View  Result Date: 09/19/2022 CLINICAL DATA:  Cough, congestion, body aches for the past week. EXAM: CHEST - 2 VIEW COMPARISON:  Chest x-ray dated February 23, 2014. FINDINGS: The heart size and mediastinal contours are within normal limits. Normal pulmonary vascularity. Left-greater-than-right lobe interstitial and airspace opacities. No pleural effusion or pneumothorax. No acute osseous abnormality. IMPRESSION: 1. Bilateral lower lobe pneumonia. Consider atypical viral pneumonia. Electronically Signed   By: Obie Dredge M.D.   On: 09/19/2022 11:33     Assessment & Plan:   History of pneumonia - CBC -  Comprehensive metabolic panel - DG Chest 2 View   2. Hypothyroidism, unspecified type  - Thyroid Panel With TSH - CBC - Comprehensive metabolic panel   3. Seasonal allergies  - cetirizine (ZYRTEC) 10 MG tablet; Take 1 tablet (10 mg total) by mouth daily.  Dispense: 30 tablet; Refill: 11 - guaiFENesin (MUCINEX) 600 MG 12 hr tablet; Take 1 tablet (600 mg total) by mouth 2 (two) times daily for 7 days.  Dispense: 14 tablet; Refill: 0   Follow up:  Follow up in 6 months     Ivonne Andrew, NP 10/15/2022

## 2022-10-15 NOTE — Patient Instructions (Addendum)
1. History of pneumonia  - CBC - Comprehensive metabolic panel - DG Chest 2 View   2. Hypothyroidism, unspecified type  - Thyroid Panel With TSH - CBC - Comprehensive metabolic panel   3. Seasonal allergies  - cetirizine (ZYRTEC) 10 MG tablet; Take 1 tablet (10 mg total) by mouth daily.  Dispense: 30 tablet; Refill: 11 - guaiFENesin (MUCINEX) 600 MG 12 hr tablet; Take 1 tablet (600 mg total) by mouth 2 (two) times daily for 7 days.  Dispense: 14 tablet; Refill: 0   Follow up:  Follow up in 6 months

## 2022-10-15 NOTE — Assessment & Plan Note (Signed)
-   CBC - Comprehensive metabolic panel - DG Chest 2 View   2. Hypothyroidism, unspecified type  - Thyroid Panel With TSH - CBC - Comprehensive metabolic panel   3. Seasonal allergies  - cetirizine (ZYRTEC) 10 MG tablet; Take 1 tablet (10 mg total) by mouth daily.  Dispense: 30 tablet; Refill: 11 - guaiFENesin (MUCINEX) 600 MG 12 hr tablet; Take 1 tablet (600 mg total) by mouth 2 (two) times daily for 7 days.  Dispense: 14 tablet; Refill: 0   Follow up:  Follow up in 6 months

## 2022-10-16 LAB — COMPREHENSIVE METABOLIC PANEL
ALT: 43 IU/L (ref 0–44)
AST: 28 IU/L (ref 0–40)
Albumin/Globulin Ratio: 1.2 (ref 1.2–2.2)
Albumin: 3.8 g/dL — ABNORMAL LOW (ref 3.9–4.9)
Alkaline Phosphatase: 92 IU/L (ref 44–121)
BUN/Creatinine Ratio: 18 (ref 10–24)
BUN: 17 mg/dL (ref 8–27)
Bilirubin Total: 0.4 mg/dL (ref 0.0–1.2)
CO2: 23 mmol/L (ref 20–29)
Calcium: 9.2 mg/dL (ref 8.6–10.2)
Chloride: 105 mmol/L (ref 96–106)
Creatinine, Ser: 0.96 mg/dL (ref 0.76–1.27)
Globulin, Total: 3.1 g/dL (ref 1.5–4.5)
Glucose: 92 mg/dL (ref 70–99)
Potassium: 4.1 mmol/L (ref 3.5–5.2)
Sodium: 143 mmol/L (ref 134–144)
Total Protein: 6.9 g/dL (ref 6.0–8.5)
eGFR: 86 mL/min/{1.73_m2} (ref >59)

## 2022-10-16 LAB — THYROID PANEL WITH TSH
Free Thyroxine Index: 1.8 (ref 1.2–4.9)
T3 Uptake Ratio: 28 % (ref 24–39)
T4, Total: 6.5 ug/dL (ref 4.5–12.0)
TSH: 1.85 u[IU]/mL (ref 0.450–4.500)

## 2022-10-16 LAB — CBC
Hematocrit: 37.2 % — ABNORMAL LOW (ref 37.5–51.0)
Hemoglobin: 11.9 g/dL — ABNORMAL LOW (ref 13.0–17.7)
MCH: 27.4 pg (ref 26.6–33.0)
MCHC: 32 g/dL (ref 31.5–35.7)
MCV: 86 fL (ref 79–97)
Platelets: 125 10*3/uL — ABNORMAL LOW (ref 150–450)
RBC: 4.35 x10E6/uL (ref 4.14–5.80)
RDW: 14.2 % (ref 11.6–15.4)
WBC: 4.7 10*3/uL (ref 3.4–10.8)

## 2022-10-22 ENCOUNTER — Telehealth: Payer: Self-pay | Admitting: Nurse Practitioner

## 2022-10-22 NOTE — Telephone Encounter (Signed)
Contacted Alver Fisher Laino to schedule their annual wellness visit. Appointment made for 11/06/2022.  .ste

## 2022-10-22 NOTE — Telephone Encounter (Signed)
Called patient to schedule Medicare Annual Wellness Visit (AWV). Left message for patient to call back and schedule Medicare Annual Wellness Visit (AWV).  Last date of AWV:  due 03/09/2020 awvi per [facility]  Please schedule an appointment at any time with Abby, NHA. .  If any questions, please contact me at [phone].  Thank you,  Stephanie,  AMB Clinical Support [facility] AWV Program Direct Dial ??[phone]    

## 2022-10-26 ENCOUNTER — Other Ambulatory Visit: Payer: Self-pay | Admitting: Nurse Practitioner

## 2022-10-26 DIAGNOSIS — I10 Essential (primary) hypertension: Secondary | ICD-10-CM

## 2022-11-04 NOTE — Progress Notes (Signed)
Results mail it to pt home address. Gh

## 2022-11-05 NOTE — Progress Notes (Signed)
Called pt  and left a message for pt to call back. Gh

## 2022-11-06 ENCOUNTER — Ambulatory Visit: Payer: Medicare Other

## 2022-11-12 ENCOUNTER — Encounter (HOSPITAL_COMMUNITY): Payer: Self-pay

## 2022-11-12 ENCOUNTER — Ambulatory Visit (HOSPITAL_COMMUNITY)
Admission: EM | Admit: 2022-11-12 | Discharge: 2022-11-12 | Disposition: A | Discharge status: Home / Self Care | Payer: Medicare Other | Attending: Emergency Medicine | Admitting: Emergency Medicine

## 2022-11-12 DIAGNOSIS — M25572 Pain in left ankle and joints of left foot: Secondary | ICD-10-CM | POA: Diagnosis present

## 2022-11-12 LAB — URIC ACID: Uric Acid, Serum: 9.7 mg/dL — ABNORMAL HIGH (ref 3.7–8.6)

## 2022-11-12 MED ORDER — COLCHICINE 0.6 MG PO TABS: 0.6000 mg | ORAL_TABLET | Freq: Every day | ORAL | 0 refills | Status: DC | PRN

## 2022-11-12 MED ORDER — PREDNISONE 20 MG PO TABS: 40.0000 mg | ORAL_TABLET | Freq: Every day | ORAL | 0 refills | Status: AC

## 2022-11-12 NOTE — ED Provider Notes (Signed)
[facility]    CSN: [id number] Arrival date & time: 11/12/22  1146      History   Chief Complaint Chief Complaint  Patient presents with   Foot Pain    HPI Shawn Casey is a 67 y.o. male.   Patient presents to clinic with a family member who provides the interpretation.  Reports waking up this morning with left ankle and left foot pain.  Denies any injuries, falls or trauma.  Denies any bug bites, skin issues or ulcers.  Family denies any history of gout, was seen in the fall of last year for left foot pain and placed on colchicine, did have a negative uric acid.  The history is provided by the patient and medical records. The history is limited by a language barrier. A language interpreter was used (family member).  Foot Pain    Past Medical History:  Diagnosis Date   Anxiety and depression    Chronic low back pain with right-sided sciatica    Hyperglycemia    Hyperlipidemia 02/2020   Hypertension    Hypothyroidism    Insomnia    Memory loss    Thrombocytopenia (HCC) 02/2020    Patient Active Problem List   Diagnosis Date Noted   History of pneumonia 10/15/2022   Upper respiratory tract infection 09/19/2022   Chronic post-traumatic stress disorder (PTSD) 05/10/2019   Muscle spasm of right shoulder 01/29/2019   Chronic right-sided low back pain with right-sided sciatica 01/29/2019   Essential hypertension 01/29/2019   Vitamin D deficiency 01/29/2019   Anxiety and depression 01/29/2019   Impaired mobility 01/29/2019   Language barrier 01/29/2019   Cognitive impairment 01/29/2019   PTSD (post-traumatic stress disorder) 02/23/2018   Memory loss    Hypothyroidism    Chest pain 02/23/2014   Urticaria 02/23/2014    History reviewed. No pertinent surgical history.     Home Medications    Prior to Admission medications   Medication Sig Start Date End Date Taking? Authorizing Provider  hydrochlorothiazide (MICROZIDE) 12.5 MG capsule  Take 1 capsule by mouth once daily 10/27/22  Yes Ivonne Andrew, NP  lisinopril (ZESTRIL) 20 MG tablet Take 1 tablet (20 mg total) by mouth daily. 10/14/21  Yes Ivonne Andrew, NP  predniSONE (DELTASONE) 20 MG tablet Take 2 tablets (40 mg total) by mouth daily with breakfast for 5 days. 11/12/22 11/17/22 Yes Rinaldo Ratel, [place] N, FNP  benzonatate (TESSALON) 200 MG capsule Take 1 capsule (200 mg total) by mouth 2 (two) times daily as needed for cough. 09/19/22   Ivonne Andrew, NP  busPIRone (BUSPAR) 15 MG tablet Take 1 tablet (15 mg total) by mouth 2 (two) times daily. 10/14/21   Ivonne Andrew, NP  cetirizine (ZYRTEC) 10 MG tablet Take 1 tablet (10 mg total) by mouth daily. 10/15/22   Ivonne Andrew, NP  colchicine 0.6 MG tablet Take 1 tablet (0.6 mg total) by mouth daily as needed (gout pain). 11/12/22   Fabrizio Filip, [place] N, FNP  levothyroxine (SYNTHROID) 50 MCG tablet Take 1 tablet (50 mcg total) by mouth daily before breakfast. 10/14/21   Ivonne Andrew, NP  naproxen (NAPROSYN) 500 MG tablet Take 1 tablet by mouth twice daily as needed 10/14/22   Ivonne Andrew, NP  sertraline (ZOLOFT) 100 MG tablet Take 1 tablet (100 mg total) by mouth daily. 10/14/21   Ivonne Andrew, NP  traZODone (DESYREL) 50 MG tablet Take 0.5-1 tablets (25-50 mg total) by mouth at bedtime  as needed for sleep. 10/14/21   Ivonne Andrew, NP    Family History Family History  Problem Relation Age of Onset   CAD Neg Hx     Social History Social History   Tobacco Use   Smoking status: Never   Smokeless tobacco: Never  Vaping Use   Vaping Use: Never used  Substance Use Topics   Alcohol use: No   Drug use: No     Allergies   Patient has no known allergies.   Review of Systems Review of Systems  Constitutional:  Negative for chills and fever.  Musculoskeletal:  Positive for joint swelling.  Skin:  Positive for color change.     Physical Exam Triage Vital Signs ED Triage Vitals [11/12/22 1303]  Enc Vitals  Group     BP (!) 155/72     Pulse Rate 62     Resp 18     Temp 97.6 F (36.4 C)     Temp Source Oral     SpO2 94 %     Weight      Height      Head Circumference      Peak Flow      Pain Score      Pain Loc      Pain Edu?      Excl. in GC?    No data found.  Updated Vital Signs BP (!) 155/72 (BP Location: Left Arm)   Pulse 62   Temp 97.6 F (36.4 C) (Oral)   Resp 18   SpO2 94%   Visual Acuity Right Eye Distance:   Left Eye Distance:   Bilateral Distance:    Right Eye Near:   Left Eye Near:    Bilateral Near:     Physical Exam Vitals and nursing note reviewed.  Constitutional:      Appearance: Normal appearance.  HENT:     Head: Normocephalic and atraumatic.     Right Ear: External ear normal.     Left Ear: External ear normal.     Nose: Nose normal.     Mouth/Throat:     Mouth: Mucous membranes are moist.  Eyes:     Conjunctiva/sclera: Conjunctivae normal.  Cardiovascular:     Rate and Rhythm: Normal rate.     Pulses:          Dorsalis pedis pulses are 2+ on the right side.       Posterior tibial pulses are 2+ on the right side.  Pulmonary:     Effort: Pulmonary effort is normal. No respiratory distress.  Musculoskeletal:        General: Swelling and tenderness present. No deformity or signs of injury. Normal range of motion.       Feet:  Feet:     Right foot:     Skin integrity: Skin integrity normal.     Comments: Left lateral ankle pain, swelling and erythema. No obvious skin breakdown. Neurovascularly intact w/ brisk capillary refill and sensation.  Skin:    General: Skin is warm and dry.     Capillary Refill: Capillary refill takes less than 2 seconds.     Findings: Erythema present.  Neurological:     General: No focal deficit present.     Mental Status: He is alert and oriented to person, place, and time.  Psychiatric:        Mood and Affect: Mood normal.        Behavior: Behavior normal. Behavior is cooperative.  UC Treatments  / Results  Labs (all labs ordered are listed, but only abnormal results are displayed) Labs Reviewed  URIC ACID    EKG   Radiology No results found.  Procedures Procedures (including critical care time)  Medications Ordered in UC Medications - No data to display  Initial Impression / Assessment and Plan / UC Course  I have reviewed the triage vital signs and the nursing notes.  Pertinent labs & imaging results that were available during my care of the patient were reviewed by me and considered in my medical decision making (see chart for details).  Vitals and triage reviewed, patient is hemodynamically stable.  Left lateral ankle with erythema, tenderness and swelling since waking up.  No trauma, falls, deformity or skin breakdown suggestive of cellulitis.  Afebrile without tachycardia.  Discussed that symptoms are more consistent with gout, will recheck a uric acid and started on colchicine.  Given steroids to help with pain and inflammation.  Encouraged strict follow-up with PCP for reevaluation.  Plan of care, follow-up care and return precautions given, no questions at this time.     Final Clinical Impressions(s) / UC Diagnoses   Final diagnoses:  Acute left ankle pain     Discharge Instructions      ???? ???????? ?????????? ????????? ????, ???? ???? ? ?? ?? ???? ??????? ??? ?????? ??????? ???? ????? ???????? ????? ???????? ?????? ????????? ????? ???? ??????? ?????? ???????? ???? ??????????  ????? ????? ????????? ?????? ???? ???: ???????????? ???? ????? ????? ????? ???? ???????? ???????? ??? ???-?? ?????????? ??? ????? ?????, ???????? ?? ????? 3 ????? ????? ????? ????, ?? ???? ???? ????????? ???????? ???? ????? ????? ?????? ?????? ???????????  Due to the recurrent nature of his problem, I suspect that this is a gout flareup.  Please take the colchicine daily as needed for foot pain.  Please also start the steroids tomorrow with breakfast.  Please follow-up with his  primary care within the next week for reevaluation to ensure improvement.  Please seek immediate care if he develops fever, worsening of swelling or pain over the next 3 days, or any new concerning symptoms.     ED Prescriptions     Medication Sig Dispense Auth. Provider   colchicine 0.6 MG tablet Take 1 tablet (0.6 mg total) by mouth daily as needed (gout pain). 15 tablet Rinaldo Ratel, [place] N, [place]   predniSONE (DELTASONE) 20 MG tablet Take 2 tablets (40 mg total) by mouth daily with breakfast for 5 days. 10 tablet Omega Slager, [place] N, FNP      PDMP not reviewed this encounter.   Aneira Cavitt, [place] N, [place] 11/12/22 1327

## 2022-11-12 NOTE — Discharge Instructions (Addendum)
???? ???????? ?????????? ????????? ????, ???? ???? ? ?? ?? ???? ??????? ??? ?????? ??????? ???? ????? ???????? ????? ???????? ?????? ????????? ????? ???? ??????? ?????? ???????? ???? ??????????  ????? ????? ????????? ?????? ???? ???: ???????????? ???? ????? ????? ????? ???? ???????? ???????? ??? ???-?? ?????????? ??? ????? ?????, ???????? ?? ?????   3 ????? ????? ????? ????, ?? ???? ???? ????????? ???????? ???? ????? ????? ?????? ?????? ???????????  Due to the recurrent nature of his problem, I suspect that this is a gout flareup.  Please take the colchicine daily as needed for foot pain.  Please also start the steroids tomorrow with breakfast.  Please follow-up with his primary care within the next week for reevaluation to ensure improvement.  Please seek immediate care if he develops fever, worsening of swelling or pain over the next 3 days, or any new concerning symptoms.

## 2022-11-12 NOTE — ED Triage Notes (Signed)
Pt is here for left foot pain and swelling x 3 days. Pt started he woke up with pain.

## 2022-11-13 ENCOUNTER — Other Ambulatory Visit: Payer: Self-pay | Admitting: Nurse Practitioner

## 2022-11-13 DIAGNOSIS — G8929 Other chronic pain: Secondary | ICD-10-CM

## 2022-11-13 NOTE — Telephone Encounter (Signed)
Please advise KH 

## 2022-11-17 ENCOUNTER — Other Ambulatory Visit: Payer: Self-pay | Admitting: Nurse Practitioner

## 2022-11-17 DIAGNOSIS — I10 Essential (primary) hypertension: Secondary | ICD-10-CM

## 2022-11-25 ENCOUNTER — Other Ambulatory Visit: Payer: Self-pay

## 2022-11-25 DIAGNOSIS — I10 Essential (primary) hypertension: Secondary | ICD-10-CM

## 2022-11-26 ENCOUNTER — Other Ambulatory Visit: Payer: Self-pay

## 2022-11-26 DIAGNOSIS — I10 Essential (primary) hypertension: Secondary | ICD-10-CM

## 2022-11-26 MED ORDER — HYDROCHLOROTHIAZIDE 12.5 MG PO CAPS: 12.5000 mg | ORAL_CAPSULE | Freq: Every day | ORAL | 0 refills | Status: DC

## 2022-11-26 NOTE — Telephone Encounter (Signed)
From: [name] To: Office of Ivonne Andrew, NP Sent: 11/25/2022 10:49 AM EDT Subject: Medication Renewal Request  Refills have been requested for the following medications:   hydrochlorothiazide (MICROZIDE) 12.5 MG capsule [name]]  Preferred pharmacy: [facility] - [place] ([place]), [place] - [address] Delivery method: [employer]

## 2022-11-28 ENCOUNTER — Other Ambulatory Visit: Payer: Self-pay

## 2022-11-28 DIAGNOSIS — I10 Essential (primary) hypertension: Secondary | ICD-10-CM

## 2022-11-28 MED ORDER — HYDROCHLOROTHIAZIDE 12.5 MG PO CAPS: 12.5000 mg | ORAL_CAPSULE | Freq: Every day | ORAL | 0 refills | Status: DC

## 2022-11-28 NOTE — Telephone Encounter (Signed)
From: [name] To: Office of Ivonne Andrew, NP Sent: 11/28/2022 1:19 PM EDT Subject: Medication Renewal Request  Refills have been requested for the following medications:   hydrochlorothiazide (MICROZIDE) 12.5 MG capsule Shawn Casey]  Preferred pharmacy: [facility] - [place]), [place] - [address] Delivery method: [employer]

## 2022-12-01 ENCOUNTER — Other Ambulatory Visit: Payer: Self-pay

## 2022-12-01 DIAGNOSIS — I10 Essential (primary) hypertension: Secondary | ICD-10-CM

## 2022-12-01 MED ORDER — HYDROCHLOROTHIAZIDE 12.5 MG PO CAPS: 12.5000 mg | ORAL_CAPSULE | Freq: Every day | ORAL | 0 refills | Status: DC

## 2022-12-01 NOTE — Telephone Encounter (Signed)
From: [name] To: Office of Ivonne Andrew, NP Sent: 12/01/2022 11:36 AM EDT Subject: Medication Renewal Request  Refills have been requested for the following medications:   hydrochlorothiazide (MICROZIDE) 12.5 MG capsule [Tonya S Nichols]  Patient Comment: I been requesting for refill since last week. I checked with pharmacy they said they didn't get refill request. Can you please send it again at [facility] pharmacy.   Preferred pharmacy: [facility] - [place] ([place]), [place] - [address] Delivery method: [employer]

## 2022-12-12 ENCOUNTER — Other Ambulatory Visit: Payer: Self-pay | Admitting: Nurse Practitioner

## 2022-12-12 DIAGNOSIS — G8929 Other chronic pain: Secondary | ICD-10-CM

## 2022-12-17 ENCOUNTER — Other Ambulatory Visit: Payer: Self-pay | Admitting: Nurse Practitioner

## 2022-12-17 DIAGNOSIS — E039 Hypothyroidism, unspecified: Secondary | ICD-10-CM

## 2023-01-08 ENCOUNTER — Other Ambulatory Visit: Payer: Self-pay

## 2023-01-08 DIAGNOSIS — E039 Hypothyroidism, unspecified: Secondary | ICD-10-CM

## 2023-01-08 MED ORDER — LEVOTHYROXINE SODIUM 50 MCG PO TABS: 50.0000 ug | ORAL_TABLET | Freq: Every day | ORAL | 0 refills | Status: DC

## 2023-03-13 ENCOUNTER — Other Ambulatory Visit: Payer: Self-pay | Admitting: Nurse Practitioner

## 2023-03-16 ENCOUNTER — Other Ambulatory Visit: Payer: Self-pay | Admitting: Nurse Practitioner

## 2023-03-16 DIAGNOSIS — E039 Hypothyroidism, unspecified: Secondary | ICD-10-CM

## 2023-04-17 ENCOUNTER — Ambulatory Visit: Payer: Self-pay | Admitting: Nurse Practitioner

## 2023-04-20 ENCOUNTER — Encounter: Payer: Self-pay | Admitting: Nurse Practitioner

## 2023-04-20 ENCOUNTER — Ambulatory Visit (INDEPENDENT_AMBULATORY_CARE_PROVIDER_SITE_OTHER): Payer: Medicare Other | Admitting: Nurse Practitioner

## 2023-04-20 VITALS — BP 132/71 | HR 67 | Ht 60.0 in | Wt 127.0 lb

## 2023-04-20 DIAGNOSIS — E039 Hypothyroidism, unspecified: Secondary | ICD-10-CM | POA: Diagnosis not present

## 2023-04-20 DIAGNOSIS — R9389 Abnormal findings on diagnostic imaging of other specified body structures: Secondary | ICD-10-CM

## 2023-04-20 DIAGNOSIS — Z23 Encounter for immunization: Secondary | ICD-10-CM | POA: Diagnosis not present

## 2023-04-20 DIAGNOSIS — I10 Essential (primary) hypertension: Secondary | ICD-10-CM | POA: Diagnosis not present

## 2023-04-20 DIAGNOSIS — Z13220 Encounter for screening for lipoid disorders: Secondary | ICD-10-CM

## 2023-04-20 NOTE — Patient Instructions (Addendum)
1. Need for influenza vaccination  - Flu Vaccine Trivalent High Dose (Fluad)  2. Essential hypertension  - CBC - Comprehensive metabolic panel  3. Acquired hypothyroidism  - Thyroid Panel With TSH  4. Lipid screening  - Lipid Panel  5. Abnormal chest x-ray  - Ambulatory referral to Pulmonology  Follow up:  Follow up in 3 months

## 2023-04-20 NOTE — Progress Notes (Signed)
Subjective   Patient ID: Shawn Casey, male    DOB: [birth year], 67 y.o.   MRN: [id number]  Chief Complaint  Patient presents with   Follow-up    Referring provider: Ivonne Andrew, NP  Shawn Casey is a 67 y.o. male with Past Medical History: No date: Anxiety and depression No date: Chronic low back pain with right-sided sciatica No date: Hyperglycemia 02/2020: Hyperlipidemia No date: Hypertension No date: Hypothyroidism No date: Insomnia No date: Memory loss 02/2020: Thrombocytopenia (HCC)   HPI  Patient presents today for follow-up on hypertension and thyroid.  Overall he has been doing well with no new issues or concerns.  His son does report that they check his blood pressure at home and it has been normal.  Patient is compliant with medications. Denies f/c/s, n/v/d, hemoptysis, PND, leg swelling. Denies chest pain or edema.   Patient does have recent history of pneumonia.  Overall patient is feeling improved.  Latest x-ray did show but also noted questionable interstitial lung disease versus scarring lung bases.  Upon exam today it was noted that patient had crackles to bases.  Will refer to pulmonary for further evaluation and treatment.   No Known Allergies  Immunization History  Administered Date(s) Administered   Influenza,inj,Quad PF,6+ Mos 04/15/2021   PFIZER(Purple Top)SARS-COV-2 Vaccination 08/25/2019, 09/13/2019   Pneumococcal Polysaccharide-23 10/15/2022    Tobacco History: Social History   Tobacco Use  Smoking Status Never  Smokeless Tobacco Never   Counseling given: Not Answered   Outpatient Encounter Medications as of 04/20/2023  Medication Sig   benzonatate (TESSALON) 200 MG capsule Take 1 capsule (200 mg total) by mouth 2 (two) times daily as needed for cough.   busPIRone (BUSPAR) 15 MG tablet Take 1 tablet (15 mg total) by mouth 2 (two) times daily.   cetirizine (ZYRTEC) 10 MG tablet Take 1 tablet (10 mg total) by  mouth daily.   colchicine 0.6 MG tablet Take 1 tablet (0.6 mg total) by mouth daily as needed (gout pain).   hydrochlorothiazide (MICROZIDE) 12.5 MG capsule Take 1 capsule (12.5 mg total) by mouth daily.   levothyroxine (SYNTHROID) 50 MCG tablet TAKE 1 TABLET BY MOUTH ONCE DAILY BEFORE  BREAKFAST.   lisinopril (ZESTRIL) 20 MG tablet Take 1 tablet by mouth once daily   naproxen (NAPROSYN) 500 MG tablet Take 1 tablet by mouth twice daily as needed   sertraline (ZOLOFT) 100 MG tablet Take 1 tablet (100 mg total) by mouth daily.   traZODone (DESYREL) 50 MG tablet Take 0.5-1 tablets (25-50 mg total) by mouth at bedtime as needed for sleep.   No facility-administered encounter medications on file as of 04/20/2023.    Review of Systems  Review of Systems  Constitutional: Negative.   HENT: Negative.    Cardiovascular: Negative.   Gastrointestinal: Negative.   Allergic/Immunologic: Negative.   Neurological: Negative.   Psychiatric/Behavioral: Negative.       Objective:   BP 132/71 (BP Location: Right Arm, Patient Position: Sitting, Cuff Size: Normal)   Pulse 67   Ht 5' (1.524 m)   Wt 127 lb (57.6 kg)   SpO2 96%   BMI 24.80 kg/m   Wt Readings from Last 5 Encounters:  04/20/23 127 lb (57.6 kg)  10/15/22 120 lb 9.6 oz (54.7 kg)  09/19/22 112 lb 12.8 oz (51.2 kg)  04/16/22 123 lb (55.8 kg)  04/15/21 120 lb 12.8 oz (54.8 kg)     Physical Exam Vitals and nursing note reviewed.  Constitutional:      General: He is not in acute distress.    Appearance: He is well-developed.  Cardiovascular:     Rate and Rhythm: Normal rate and regular rhythm.  Pulmonary:     Effort: Pulmonary effort is normal.     Breath sounds: Normal breath sounds.  Skin:    General: Skin is warm and dry.  Neurological:     Mental Status: He is alert and oriented to person, place, and time.       Assessment & Plan:   Need for influenza vaccination -     Flu Vaccine Trivalent High Dose  (Fluad)  Essential hypertension -     CBC -     Comprehensive metabolic panel  Acquired hypothyroidism -     Thyroid Panel With TSH  Lipid screening -     Lipid panel  Abnormal chest x-ray -     Ambulatory referral to Pulmonology     Return in about 3 months (around 07/21/2023).   Ivonne Andrew, NP 04/20/2023

## 2023-04-21 LAB — CBC
Hematocrit: 44.2 % (ref 37.5–51.0)
Hemoglobin: 14.1 g/dL (ref 13.0–17.7)
MCH: 27.3 pg (ref 26.6–33.0)
MCHC: 31.9 g/dL (ref 31.5–35.7)
MCV: 86 fL (ref 79–97)
Platelets: 127 x10E3/uL — ABNORMAL LOW (ref 150–450)
RBC: 5.17 x10E6/uL (ref 4.14–5.80)
RDW: 12.6 % (ref 11.6–15.4)
WBC: 5.1 x10E3/uL (ref 3.4–10.8)

## 2023-04-21 LAB — COMPREHENSIVE METABOLIC PANEL
ALT: 31 [IU]/L (ref 0–44)
AST: 29 [IU]/L (ref 0–40)
Albumin: 4.3 g/dL (ref 3.9–4.9)
Alkaline Phosphatase: 83 [IU]/L (ref 44–121)
BUN/Creatinine Ratio: 13 (ref 10–24)
BUN: 15 mg/dL (ref 8–27)
Bilirubin Total: 0.6 mg/dL (ref 0.0–1.2)
CO2: 25 mmol/L (ref 20–29)
Calcium: 9.2 mg/dL (ref 8.6–10.2)
Chloride: 101 mmol/L (ref 96–106)
Creatinine, Ser: 1.12 mg/dL (ref 0.76–1.27)
Globulin, Total: 3.2 g/dL (ref 1.5–4.5)
Glucose: 164 mg/dL — ABNORMAL HIGH (ref 70–99)
Potassium: 4 mmol/L (ref 3.5–5.2)
Sodium: 141 mmol/L (ref 134–144)
Total Protein: 7.5 g/dL (ref 6.0–8.5)
eGFR: 71 mL/min/{1.73_m2} (ref >59)

## 2023-04-21 LAB — LIPID PANEL
Chol/HDL Ratio: 7.1 ratio — ABNORMAL HIGH (ref 0.0–5.0)
Cholesterol, Total: 177 mg/dL (ref 100–199)
HDL: 25 mg/dL — ABNORMAL LOW (ref >39)
LDL Chol Calc (NIH): 107 mg/dL — ABNORMAL HIGH (ref 0–99)
Triglycerides: 262 mg/dL — ABNORMAL HIGH (ref 0–149)
VLDL Cholesterol Cal: 45 mg/dL — ABNORMAL HIGH (ref 5–40)

## 2023-04-21 LAB — THYROID PANEL WITH TSH
Free Thyroxine Index: 2.2 (ref 1.2–4.9)
T3 Uptake Ratio: 31 % (ref 24–39)
T4, Total: 7 ug/dL (ref 4.5–12.0)
TSH: 2.5 u[IU]/mL (ref 0.450–4.500)

## 2023-04-22 ENCOUNTER — Other Ambulatory Visit: Payer: Self-pay | Admitting: Nurse Practitioner

## 2023-04-22 MED ORDER — ROSUVASTATIN CALCIUM 5 MG PO TABS: 5.0000 mg | ORAL_TABLET | Freq: Every day | ORAL | 11 refills | Status: DC

## 2023-06-09 ENCOUNTER — Other Ambulatory Visit: Payer: Self-pay | Admitting: Nurse Practitioner

## 2023-06-18 ENCOUNTER — Encounter (HOSPITAL_COMMUNITY): Payer: Self-pay | Admitting: Emergency Medicine

## 2023-06-18 ENCOUNTER — Ambulatory Visit (HOSPITAL_COMMUNITY)
Admission: EM | Admit: 2023-06-18 | Discharge: 2023-06-18 | Disposition: A | Discharge status: Home / Self Care | Payer: Medicare Other | Attending: Physician Assistant | Admitting: Physician Assistant

## 2023-06-18 DIAGNOSIS — B349 Viral infection, unspecified: Secondary | ICD-10-CM | POA: Diagnosis not present

## 2023-06-18 DIAGNOSIS — J069 Acute upper respiratory infection, unspecified: Secondary | ICD-10-CM | POA: Diagnosis not present

## 2023-06-18 DIAGNOSIS — M109 Gout, unspecified: Secondary | ICD-10-CM

## 2023-06-18 MED ORDER — COLCHICINE 0.6 MG PO TABS: 0.6000 mg | ORAL_TABLET | Freq: Two times a day (BID) | ORAL | 1 refills | Status: DC

## 2023-06-18 NOTE — Discharge Instructions (Addendum)
 Try Delsyn cough medication for symptoms.  Return if any problems.

## 2023-06-18 NOTE — ED Provider Notes (Signed)
 [facility]    CSN: [id number] Arrival date & time: 06/18/23  9178      History   Chief Complaint Chief Complaint  Patient presents with   Foot Pain   Mouth Lesions    HPI Shawn Casey is a 68 y.o. male.   Pt complains of pain in his right foot.  Pt has a history of gout.  Patient reports this is the same as previous episodes of gout.  Patient also complains of a sore throat sores on his mouth and a cough.  Patient's family member reports that patient's wife was here 2 days ago and diagnosed with influenza.  Patient has been sick for over a week.  He is not having any fever or shortness of breath.  The history is provided by the patient. No language interpreter was used.  Foot Pain This is a recurrent problem. The problem occurs constantly. The problem has not changed since onset.Nothing aggravates the symptoms. Nothing relieves the symptoms.  Mouth Lesions   Past Medical History:  Diagnosis Date   Anxiety and depression    Chronic low back pain with right-sided sciatica    Hyperglycemia    Hyperlipidemia 02/2020   Hypertension    Hypothyroidism    Insomnia    Memory loss    Thrombocytopenia (HCC) 02/2020    Patient Active Problem List   Diagnosis Date Noted   History of pneumonia 10/15/2022   Upper respiratory tract infection 09/19/2022   Chronic post-traumatic stress disorder (PTSD) 05/10/2019   Muscle spasm of right shoulder 01/29/2019   Chronic right-sided low back pain with right-sided sciatica 01/29/2019   Essential hypertension 01/29/2019   Vitamin D  deficiency 01/29/2019   Anxiety and depression 01/29/2019   Impaired mobility 01/29/2019   Language barrier 01/29/2019   Cognitive impairment 01/29/2019   PTSD (post-traumatic stress disorder) 02/23/2018   Memory loss    Hypothyroidism    Chest pain 02/23/2014   Urticaria 02/23/2014    History reviewed. No pertinent surgical history.     Home Medications    Prior to Admission  medications   Medication Sig Start Date End Date Taking? Authorizing Provider  benzonatate  (TESSALON ) 200 MG capsule Take 1 capsule (200 mg total) by mouth 2 (two) times daily as needed for cough. 09/19/22   Oley Bascom RAMAN, NP  busPIRone  (BUSPAR ) 15 MG tablet Take 1 tablet (15 mg total) by mouth 2 (two) times daily. 10/14/21   Oley Bascom RAMAN, NP  cetirizine  (ZYRTEC ) 10 MG tablet Take 1 tablet (10 mg total) by mouth daily. 10/15/22   Oley Bascom RAMAN, NP  colchicine  0.6 MG tablet Take 1 tablet (0.6 mg total) by mouth daily as needed (gout pain). 11/12/22   Dreama, [place]  N, FNP  hydrochlorothiazide  (MICROZIDE ) 12.5 MG capsule Take 1 capsule (12.5 mg total) by mouth daily. 12/01/22   Oley Bascom RAMAN, NP  levothyroxine  (SYNTHROID ) 50 MCG tablet TAKE 1 TABLET BY MOUTH ONCE DAILY BEFORE  BREAKFAST. 03/17/23   Oley Bascom RAMAN, NP  lisinopril  (ZESTRIL ) 20 MG tablet Take 1 tablet by mouth once daily 06/11/23   Nichols, Tonya S, NP  naproxen  (NAPROSYN ) 500 MG tablet Take 1 tablet by mouth twice daily as needed 12/12/22   Nichols, Tonya S, NP  rosuvastatin  (CRESTOR ) 5 MG tablet Take 1 tablet (5 mg total) by mouth daily. 04/22/23 04/21/24  Oley Bascom RAMAN, NP  sertraline  (ZOLOFT ) 100 MG tablet Take 1 tablet (100 mg total) by mouth daily. 10/14/21   Oley,  Tonya S, NP  traZODone  (DESYREL ) 50 MG tablet Take 0.5-1 tablets (25-50 mg total) by mouth at bedtime as needed for sleep. 10/14/21   Oley Bascom RAMAN, NP    Family History Family History  Problem Relation Age of Onset   CAD Neg Hx     Social History Social History   Tobacco Use   Smoking status: Never   Smokeless tobacco: Never  Vaping Use   Vaping status: Never Used  Substance Use Topics   Alcohol use: No   Drug use: No     Allergies   Patient has no known allergies.   Review of Systems Review of Systems  HENT:  Positive for mouth sores.   All other systems reviewed and are negative.    Physical Exam Triage Vital Signs ED Triage  Vitals [06/18/23 0844]  Encounter Vitals Group     BP 116/71     Systolic BP Percentile      Diastolic BP Percentile      Pulse Rate 82     Resp 17     Temp 97.9 F (36.6 C)     Temp Source Oral     SpO2 96 %     Weight      Height      Head Circumference      Peak Flow      Pain Score      Pain Loc      Pain Education      Exclude from Growth Chart    No data found.  Updated Vital Signs BP 116/71 (BP Location: Left Arm)   Pulse 82   Temp 97.9 F (36.6 C) (Oral)   Resp 17   SpO2 96%   Visual Acuity Right Eye Distance:   Left Eye Distance:   Bilateral Distance:    Right Eye Near:   Left Eye Near:    Bilateral Near:     Physical Exam Vitals and nursing note reviewed.  Constitutional:      Appearance: He is well-developed.  HENT:     Head: Normocephalic.     Nose: Nose normal.     Mouth/Throat:     Comments: Healing scabbed area left lower lip, does not appear to be a sore or lesion Eyes:     Pupils: Pupils are equal, round, and reactive to light.  Cardiovascular:     Rate and Rhythm: Normal rate.  Pulmonary:     Effort: Pulmonary effort is normal.  Abdominal:     General: There is no distension.  Musculoskeletal:        General: Tenderness present. No swelling.     Comments: Tender right foot pain with movement neurovascular neurosensory intact  Skin:    General: Skin is warm.  Neurological:     General: No focal deficit present.     Mental Status: He is alert and oriented to person, place, and time.  Psychiatric:        Mood and Affect: Mood normal.      UC Treatments / Results  Labs (all labs ordered are listed, but only abnormal results are displayed) Labs Reviewed - No data to display  EKG   Radiology No results found.  Procedures Procedures (including critical care time)  Medications Ordered in UC Medications - No data to display  Initial Impression / Assessment and Plan / UC Course  I have reviewed the triage vital signs and  the nursing notes.  Pertinent labs & imaging results that  were available during my care of the patient were reviewed by me and considered in my medical decision making (see chart for details).     Suspect patient has had influenza.  He is a week into the illness.  Patient has normal vital signs normal pulse ox.  Will treat his gout and I advised symptomatic care for sore throat and cough. Final Clinical Impressions(s) / UC Diagnoses   Final diagnoses:  Acute gout of right foot, unspecified cause  Viral illness     Discharge Instructions      Try Delsyn cough medication for symptoms.  Return if any problems.     ED Prescriptions   None    PDMP not reviewed this encounter. An After Visit Summary was printed and given to the patient.       Flint Sonny POUR, PA-C 06/18/23 [phone]

## 2023-06-18 NOTE — ED Triage Notes (Signed)
 Pt c/o right foot pain and swelling for more than 10 days. Taking Ibuprofen. Pt having trouble walking due to pain and swelling. Denies injury.  Pt also c/o bumps on lip, itchy eyes, and sore throat for a week.

## 2023-07-22 ENCOUNTER — Encounter: Payer: Self-pay | Admitting: Nurse Practitioner

## 2023-07-22 ENCOUNTER — Ambulatory Visit (INDEPENDENT_AMBULATORY_CARE_PROVIDER_SITE_OTHER): Payer: Medicare Other | Admitting: Nurse Practitioner

## 2023-07-22 ENCOUNTER — Ambulatory Visit (HOSPITAL_COMMUNITY)
Admission: RE | Admit: 2023-07-22 | Discharge: 2023-07-22 | Disposition: A | Discharge status: Home / Self Care | Payer: Medicare Other | Source: Ambulatory Visit | Attending: Nurse Practitioner | Admitting: Nurse Practitioner

## 2023-07-22 VITALS — BP 140/74 | HR 63 | Temp 97.9°F | Wt 128.4 lb

## 2023-07-22 DIAGNOSIS — Z8701 Personal history of pneumonia (recurrent): Secondary | ICD-10-CM | POA: Diagnosis present

## 2023-07-22 MED ORDER — ROSUVASTATIN CALCIUM 5 MG PO TABS: 5.0000 mg | ORAL_TABLET | Freq: Every day | ORAL | 11 refills | Status: AC

## 2023-07-22 NOTE — Progress Notes (Unsigned)
Subjective   Patient ID: Shawn Casey, male    DOB: 06/24/1955, 68 y.o.   MRN: [id number]  Chief Complaint  Patient presents with   Medical Management of Chronic Issues    Referring provider: Ivonne Andrew, NP  Shawn Casey is a 68 y.o. male with Past Medical History: No date: Anxiety and depression No date: Chronic low back pain with right-sided sciatica No date: Hyperglycemia 02/2020: Hyperlipidemia No date: Hypertension No date: Hypothyroidism No date: Insomnia No date: Memory loss 02/2020: Thrombocytopenia (HCC)   HPI  Patient presents today for follow-up on hypertension and thyroid.  Overall he has been doing well with no new issues or concerns.  His son does report that they check his blood pressure at home and it has been normal.  Patient is compliant with medications. Denies f/c/s, n/v/d, hemoptysis, PND, leg swelling. Denies chest pain or edema.     No Known Allergies  Immunization History  Administered Date(s) Administered   Fluad Trivalent(High Dose 65+) 04/20/2023   Influenza,inj,Quad PF,6+ Mos 04/15/2021   PFIZER(Purple Top)SARS-COV-2 Vaccination 08/25/2019, 09/13/2019   Pneumococcal Polysaccharide-23 10/15/2022    Tobacco History: Social History   Tobacco Use  Smoking Status Never  Smokeless Tobacco Never   Counseling given: Not Answered   Outpatient Encounter Medications as of 07/22/2023  Medication Sig   busPIRone (BUSPAR) 15 MG tablet Take 1 tablet (15 mg total) by mouth 2 (two) times daily.   cetirizine (ZYRTEC) 10 MG tablet Take 1 tablet (10 mg total) by mouth daily.   hydrochlorothiazide (MICROZIDE) 12.5 MG capsule Take 1 capsule (12.5 mg total) by mouth daily.   levothyroxine (SYNTHROID) 50 MCG tablet TAKE 1 TABLET BY MOUTH ONCE DAILY BEFORE  BREAKFAST.   lisinopril (ZESTRIL) 20 MG tablet Take 1 tablet by mouth once daily   naproxen (NAPROSYN) 500 MG tablet Take 1 tablet by mouth twice daily as needed    sertraline (ZOLOFT) 100 MG tablet Take 1 tablet (100 mg total) by mouth daily.   traZODone (DESYREL) 50 MG tablet Take 0.5-1 tablets (25-50 mg total) by mouth at bedtime as needed for sleep.   [DISCONTINUED] rosuvastatin (CRESTOR) 5 MG tablet Take 1 tablet (5 mg total) by mouth daily.   benzonatate (TESSALON) 200 MG capsule Take 1 capsule (200 mg total) by mouth 2 (two) times daily as needed for cough. (Patient not taking: Reported on 07/22/2023)   colchicine 0.6 MG tablet Take 1 tablet (0.6 mg total) by mouth 2 (two) times daily. (Patient not taking: Reported on 07/22/2023)   rosuvastatin (CRESTOR) 5 MG tablet Take 1 tablet (5 mg total) by mouth daily.   No facility-administered encounter medications on file as of 07/22/2023.    Review of Systems  Review of Systems  Constitutional: Negative.   HENT: Negative.    Cardiovascular: Negative.   Gastrointestinal: Negative.   Allergic/Immunologic: Negative.   Neurological: Negative.   Psychiatric/Behavioral: Negative.       Objective:   BP (!) 140/74   Pulse 63   Temp 97.9 F (36.6 C)   Wt 128 lb 6.4 oz (58.2 kg)   SpO2 96%   BMI 25.08 kg/m   Wt Readings from Last 5 Encounters:  07/22/23 128 lb 6.4 oz (58.2 kg)  04/20/23 127 lb (57.6 kg)  10/15/22 120 lb 9.6 oz (54.7 kg)  09/19/22 112 lb 12.8 oz (51.2 kg)  04/16/22 123 lb (55.8 kg)     Physical Exam Vitals and nursing note reviewed.  Constitutional:  General: He is not in acute distress.    Appearance: He is well-developed.  Cardiovascular:     Rate and Rhythm: Normal rate and regular rhythm.  Pulmonary:     Effort: Pulmonary effort is normal.     Breath sounds: Normal breath sounds.  Skin:    General: Skin is warm and dry.  Neurological:     Mental Status: He is alert and oriented to person, place, and time.     {Labs (Optional):23779}  Assessment & Plan:   History of pneumonia -     DG Chest 2 View  Other orders -     Rosuvastatin Calcium; Take 1 tablet  (5 mg total) by mouth daily.  Dispense: 30 tablet; Refill: 11     Return in about 6 months (around 01/19/2024).     Ivonne Andrew, NP 07/22/2023

## 2023-07-22 NOTE — Patient Instructions (Signed)
1. History of pneumonia (Primary)  - DG Chest 2 View  Follow up:  Follow up in 6 months

## 2023-08-07 ENCOUNTER — Other Ambulatory Visit: Payer: Self-pay | Admitting: Nurse Practitioner

## 2023-08-07 DIAGNOSIS — R9389 Abnormal findings on diagnostic imaging of other specified body structures: Secondary | ICD-10-CM

## 2023-08-19 ENCOUNTER — Ambulatory Visit: Admitting: Internal Medicine

## 2023-08-19 ENCOUNTER — Encounter: Payer: Self-pay | Admitting: Internal Medicine

## 2023-08-19 VITALS — BP 90/66 | HR 72 | Temp 97.7°F | Ht 60.0 in | Wt 126.6 lb

## 2023-08-19 DIAGNOSIS — R9389 Abnormal findings on diagnostic imaging of other specified body structures: Secondary | ICD-10-CM

## 2023-08-19 DIAGNOSIS — J111 Influenza due to unidentified influenza virus with other respiratory manifestations: Secondary | ICD-10-CM

## 2023-08-19 DIAGNOSIS — Z87891 Personal history of nicotine dependence: Secondary | ICD-10-CM

## 2023-08-19 DIAGNOSIS — R058 Other specified cough: Secondary | ICD-10-CM

## 2023-08-19 MED ORDER — ALBUTEROL SULFATE HFA 108 (90 BASE) MCG/ACT IN AERS: 2.0000 | INHALATION_SPRAY | Freq: Four times a day (QID) | RESPIRATORY_TRACT | 2 refills | Status: AC | PRN

## 2023-08-19 NOTE — Progress Notes (Unsigned)
 [facility] Consultation      Date: 08/19/2023,   MRN# [id number] Shawn Casey [facility] 06/06/1956      CHIEF COMPLAINT:   Abnormal chest x-ray   HISTORY OF PRESENT ILLNESS   68 year old [place] male seen with his son who had refused interpreter with ongoing cough over the last 1 month  Patient was exposed to his wife who had influenza A  patient subsequent developed shortness of breath sore throat cough chest congestion and most likely has influenza A as well, patient has not been given any antibiotics steroids or inhaler therapy at this time  Patient is not in any respiratory distress No exacerbation at this time No evidence of heart failure at this time No evidence or signs of infection at this time No respiratory distress No fevers, chills, nausea, vomiting, diarrhea No evidence of lower extremity edema No evidence hemoptysis  Patient is a former smoker quit 35 years ago Patient seems to be very active at this time working in the yard He is very active and does not seem to have any respiratory symptoms  Most of his symptoms happen at nighttime when he lays down I have asked patient and his son had to see if symptoms of reflux Patient is not on any type of antiacid medication I have explained to him that this could be the cause for his cough referral may consider therapy for GERD  The son gave all the history At this time and the son is not looking forward to many medications and testing We have discussed starting albuterol inhaler and seeing if his cough is better   Chest x-ray February 2025 Independently reviewed by me today Findings relayed to patient and son There seems to be bilateral interstitial infiltrates that are mild in the lower lobes Recommend CT scan for further evaluation however the patient and son do not want much testing  PAST MEDICAL HISTORY   Past Medical History:  Diagnosis Date   Anxiety and depression    Chronic low  back pain with right-sided sciatica    Hyperglycemia    Hyperlipidemia 02/2020   Hypertension    Hypothyroidism    Insomnia    Memory loss    Thrombocytopenia (HCC) 02/2020     SURGICAL HISTORY   No past surgical history on file.   FAMILY HISTORY   Family History  Problem Relation Age of Onset   CAD Neg Hx      SOCIAL HISTORY   Social History   Tobacco Use   Smoking status: Never   Smokeless tobacco: Never  Vaping Use   Vaping status: Never Used  Substance Use Topics   Alcohol use: No   Drug use: No     MEDICATIONS    Home Medication:  Current Outpatient Rx   Order #: [id number] Class: Normal   Order #: [id number] Class: Normal   Order #: [id number] Class: Normal   Order #: [id number] Class: Normal   Order #: [id number] Class: Normal   Order #: [id number] Class: Normal   Order #: [id number] Class: Normal   Order #: [id number] Class: Normal   Order #: [id number] Class: Normal   Order #: [id number] Class: Normal   Order #: [id number] Class: Normal    Current Medication:  Current Outpatient Medications:    benzonatate (TESSALON) 200 MG capsule, Take 1 capsule (200 mg total) by mouth 2 (two) times daily as needed for cough. (Patient not taking: Reported on 07/22/2023), Disp: 20 capsule, Rfl: 0   busPIRone (BUSPAR) 15 MG tablet, Take 1 tablet (15  mg total) by mouth 2 (two) times daily., Disp: 180 tablet, Rfl: 3   cetirizine (ZYRTEC) 10 MG tablet, Take 1 tablet (10 mg total) by mouth daily., Disp: 30 tablet, Rfl: 11   colchicine 0.6 MG tablet, Take 1 tablet (0.6 mg total) by mouth 2 (two) times daily. (Patient not taking: Reported on 07/22/2023), Disp: 30 tablet, Rfl: 1   hydrochlorothiazide (MICROZIDE) 12.5 MG capsule, Take 1 capsule (12.5 mg total) by mouth daily., Disp: 90 capsule, Rfl: 0   levothyroxine (SYNTHROID) 50 MCG tablet, TAKE 1 TABLET BY MOUTH ONCE DAILY BEFORE  BREAKFAST., Disp: 30 tablet, Rfl: 0   lisinopril (ZESTRIL) 20 MG tablet, Take 1 tablet by mouth once daily,  Disp: 90 tablet, Rfl: 0   naproxen (NAPROSYN) 500 MG tablet, Take 1 tablet by mouth twice daily as needed, Disp: 60 tablet, Rfl: 0   rosuvastatin (CRESTOR) 5 MG tablet, Take 1 tablet (5 mg total) by mouth daily., Disp: 30 tablet, Rfl: 11   sertraline (ZOLOFT) 100 MG tablet, Take 1 tablet (100 mg total) by mouth daily., Disp: 90 tablet, Rfl: 3   traZODone (DESYREL) 50 MG tablet, Take 0.5-1 tablets (25-50 mg total) by mouth at bedtime as needed for sleep., Disp: 30 tablet, Rfl: 11    ALLERGIES   Patient has no known allergies.     REVIEW OF SYSTEMS    Review of Systems:  Gen:  Denies  fever, sweats, chills weigh loss  HEENT: Denies blurred vision, double vision, ear pain, eye pain, hearing loss, nose bleeds, sore throat Cardiac:  No dizziness, chest pain or heaviness, chest tightness,edema Resp:   +cough negative shortness of breath,+wheezing, negative hemoptysis,  Gi: Denies swallowing difficulty, stomach pain, nausea or vomiting, diarrhea, constipation, bowel incontinence Gu:  Denies bladder incontinence, burning urine Ext:   Denies Joint pain, stiffness or swelling Skin: Denies  skin rash, easy bruising or bleeding or hives Endoc:  Denies polyuria, polydipsia , polyphagia or weight change Psych:   Denies depression, insomnia or hallucinations   Other:  All other systems negative   BP 90/66 (BP Location: Left Arm, Patient Position: Sitting, Cuff Size: Normal)   Pulse 72   Temp 97.7 F (36.5 C) (Temporal)   Ht 5' (1.524 m)   Wt 126 lb 9.6 oz (57.4 kg)   SpO2 97%   BMI 24.72 kg/m    PHYSICAL EXAM  General Appearance: No distress  EYES PERRLA, EOM intact.   NECK Supple, No JVD Pulmonary: normal breath sounds, No wheezing.  CardiovascularNormal S1,S2.  No m/r/g.   Abdomen: Benign, Soft, non-tender. Skin:   warm, no rashes, no ecchymosis  Extremities: normal, no cyanosis, clubbing. Neuro:without focal findings,  speech normal  PSYCHIATRIC: Mood, affect within normal  limits.   ALL OTHER ROS ARE NEGATIVE      IMAGING    DG Chest 2 View Result Date: 08/06/2023 CLINICAL DATA:  History of pneumonia. EXAM: CHEST - 2 VIEW COMPARISON:  Radiograph 10/15/2022 FINDINGS: Worsening subpleural reticulation with a basilar predominant distribution. No confluent airspace disease. Central bronchial thickening persists. Normal heart size with stable mediastinal contours. No pneumothorax or significant pleural effusion. No acute osseous findings. IMPRESSION: Worsening subpleural reticulation with a basilar predominant distribution, suspicious for interstitial lung disease. Recommend high-resolution chest CT for characterization. Electronically Signed   By: Narda Rutherford M.D.   On: 08/06/2023 18:15      ASSESSMENT/PLAN   69 year old pleasant [place] male seen today for exposure to influenza A viral infection With with ongoing  cough and some intermittent wheezing. The son did not want an interpreter at this time All history was obtained from the son   Postviral cough syndrome Recommend starting albuterol inhaler Son does not want a lot of meds I will upscale meds as needed  For now,  Recommend using albuterol inhaler 2 puffs every 4 hours as needed for cough and wheezing  If this does not work we will plan to start therapy for acid reflux If that does not work then we will proceed with antibiotics and prednisone If symptoms persist then I will obtain a CT chest  Avoid Allergens and Irritants Avoid secondhand smoke Avoid SICK contacts Recommend  Masking  when appropriate Recommend Keep up-to-date with vaccinations  CURRENT MEDICATIONS REVIEWED AT LENGTH WITH PATIENT TODAY   Patient  satisfied with Plan of action and management. All questions answered  Follow up 4 weeks  I spent a total of 61 minutes reviewing chart data, face-to-face evaluation with the patient, counseling and coordination of care as detailed above.     Lucie Leather, M.D.   [facility]  Medical Director [facility] Medical Director [facility] Cardio-Pulmonary Department

## 2023-08-19 NOTE — Patient Instructions (Signed)
 Recommend using albuterol inhaler 2 puffs every 4 hours as needed for cough and wheezing  If this does not work we will plan to start therapy for acid reflux If that does not work then we will proceed with antibiotics and prednisone If symptoms persist then I will obtain a CT chest  Avoid Allergens and Irritants Avoid secondhand smoke Avoid SICK contacts Recommend  Masking  when appropriate Recommend Keep up-to-date with vaccinations

## 2023-09-06 ENCOUNTER — Other Ambulatory Visit: Payer: Self-pay | Admitting: Nurse Practitioner

## 2023-09-06 DIAGNOSIS — E039 Hypothyroidism, unspecified: Secondary | ICD-10-CM

## 2023-09-08 ENCOUNTER — Other Ambulatory Visit: Payer: Self-pay | Admitting: Nurse Practitioner

## 2023-09-30 ENCOUNTER — Ambulatory Visit: Admitting: Internal Medicine

## 2023-10-05 ENCOUNTER — Other Ambulatory Visit: Payer: Self-pay | Admitting: Nurse Practitioner

## 2023-10-05 DIAGNOSIS — E039 Hypothyroidism, unspecified: Secondary | ICD-10-CM

## 2023-10-26 ENCOUNTER — Other Ambulatory Visit: Payer: Self-pay | Admitting: Nurse Practitioner

## 2023-10-26 DIAGNOSIS — I10 Essential (primary) hypertension: Secondary | ICD-10-CM

## 2023-11-01 ENCOUNTER — Other Ambulatory Visit: Payer: Self-pay | Admitting: Nurse Practitioner

## 2023-11-01 DIAGNOSIS — E039 Hypothyroidism, unspecified: Secondary | ICD-10-CM

## 2023-11-30 ENCOUNTER — Other Ambulatory Visit: Payer: Self-pay | Admitting: Nurse Practitioner

## 2023-11-30 DIAGNOSIS — E039 Hypothyroidism, unspecified: Secondary | ICD-10-CM

## 2023-12-04 ENCOUNTER — Other Ambulatory Visit: Payer: Self-pay | Admitting: Nurse Practitioner

## 2023-12-09 ENCOUNTER — Ambulatory Visit (HOSPITAL_COMMUNITY)
Admission: EM | Admit: 2023-12-09 | Discharge: 2023-12-09 | Disposition: A | Discharge status: Home / Self Care | Attending: Physician Assistant | Admitting: Physician Assistant

## 2023-12-09 ENCOUNTER — Encounter (HOSPITAL_COMMUNITY): Payer: Self-pay

## 2023-12-09 DIAGNOSIS — M1A0720 Idiopathic chronic gout, left ankle and foot, without tophus (tophi): Secondary | ICD-10-CM | POA: Diagnosis not present

## 2023-12-09 MED ORDER — COLCHICINE 0.6 MG PO TABS: 0.6000 mg | ORAL_TABLET | Freq: Two times a day (BID) | ORAL | 1 refills | Status: DC

## 2023-12-09 NOTE — ED Triage Notes (Signed)
 Patient here today with c/o left ankle and foot pain X 1 week. No known injury. Patient has a h/o gout.

## 2023-12-09 NOTE — Discharge Instructions (Addendum)
 Return if any problems.

## 2023-12-09 NOTE — ED Provider Notes (Signed)
 [facility]    CSN: [id number] Arrival date & time: 12/09/23  9093      History   Chief Complaint Chief Complaint  Patient presents with   Ankle Pain    HPI Shawn Casey is a 68 y.o. male.   Patient complains of gout in his left foot.  Patient has a history of the same.  Patient has had relief with colchicine .  Patient began having swelling and pain yesterday.  He denies any fever or chills.  Patient has a past medical history of hypertension.  The history is provided by the patient. No language interpreter was used.  Ankle Pain Location:  Foot Injury: no   Foot location:  L foot Pain details:    Radiates to:  Does not radiate   Timing:  Constant   Progression:  Worsening Dislocation: no   Relieved by:  Nothing   Past Medical History:  Diagnosis Date   Anxiety and depression    Chronic low back pain with right-sided sciatica    Hyperglycemia    Hyperlipidemia 02/2020   Hypertension    Hypothyroidism    Insomnia    Memory loss    Thrombocytopenia (HCC) 02/2020    Patient Active Problem List   Diagnosis Date Noted   History of pneumonia 10/15/2022   Upper respiratory tract infection 09/19/2022   Chronic post-traumatic stress disorder (PTSD) 05/10/2019   Muscle spasm of right shoulder 01/29/2019   Chronic right-sided low back pain with right-sided sciatica 01/29/2019   Essential hypertension 01/29/2019   Vitamin D  deficiency 01/29/2019   Anxiety and depression 01/29/2019   Impaired mobility 01/29/2019   Language barrier 01/29/2019   Cognitive impairment 01/29/2019   PTSD (post-traumatic stress disorder) 02/23/2018   Memory loss    Hypothyroidism    Chest pain 02/23/2014   Urticaria 02/23/2014    History reviewed. No pertinent surgical history.     Home Medications    Prior to Admission medications   Medication Sig Start Date End Date Taking? Authorizing Provider  albuterol  (VENTOLIN  HFA) 108 (90 Base) MCG/ACT inhaler Inhale  2 puffs into the lungs every 6 (six) hours as needed for wheezing or shortness of breath. 08/19/23   Kasa, Kurian, MD  busPIRone  (BUSPAR ) 15 MG tablet Take 1 tablet (15 mg total) by mouth 2 (two) times daily. 10/14/21   Oley Bascom RAMAN, NP  cetirizine  (ZYRTEC ) 10 MG tablet Take 1 tablet (10 mg total) by mouth daily. 10/15/22   Oley Bascom RAMAN, NP  colchicine  0.6 MG tablet Take 1 tablet (0.6 mg total) by mouth 2 (two) times daily. 12/09/23   Blessings Inglett K, PA-C  hydrochlorothiazide  (MICROZIDE ) 12.5 MG capsule Take 1 capsule by mouth once daily 10/26/23   Nichols, Tonya S, NP  levothyroxine  (SYNTHROID ) 50 MCG tablet TAKE 1 TABLET BY MOUTH ONCE DAILY BEFORE BREAKFAST 11/30/23   Nichols, Tonya S, NP  lisinopril  (ZESTRIL ) 20 MG tablet Take 1 tablet by mouth once daily 12/04/23   Nichols, Tonya S, NP  naproxen  (NAPROSYN ) 500 MG tablet Take 1 tablet by mouth twice daily as needed 12/12/22   Nichols, Tonya S, NP  rosuvastatin  (CRESTOR ) 5 MG tablet Take 1 tablet (5 mg total) by mouth daily. 07/22/23 07/21/24  Oley Bascom RAMAN, NP  sertraline  (ZOLOFT ) 100 MG tablet Take 1 tablet (100 mg total) by mouth daily. 10/14/21   Oley Bascom RAMAN, NP  traZODone  (DESYREL ) 50 MG tablet Take 0.5-1 tablets (25-50 mg total) by mouth at bedtime as needed  for sleep. Patient not taking: Reported on 12/09/2023 10/14/21   Oley Bascom RAMAN, NP    Family History Family History  Problem Relation Age of Onset   CAD Neg Hx     Social History Social History   Tobacco Use   Smoking status: Former    Average packs/day: 0.5 packs/day for 29.0 years (14.5 ttl pk-yrs)    Types: Cigarettes    Start date: 06/1968   Smokeless tobacco: Never  Vaping Use   Vaping status: Never Used  Substance Use Topics   Alcohol use: No   Drug use: No     Allergies   Patient has no known allergies.   Review of Systems Review of Systems  All other systems reviewed and are negative.    Physical Exam Triage Vital Signs ED Triage Vitals  Encounter  Vitals Group     BP 12/09/23 1013 139/87     Girls Systolic BP Percentile --      Girls Diastolic BP Percentile --      Boys Systolic BP Percentile --      Boys Diastolic BP Percentile --      Pulse Rate 12/09/23 1013 78     Resp 12/09/23 1013 16     Temp 12/09/23 1013 98.7 F (37.1 C)     Temp Source 12/09/23 1013 Oral     SpO2 12/09/23 1013 98 %     Weight --      Height --      Head Circumference --      Peak Flow --      Pain Score 12/09/23 1012 9     Pain Loc --      Pain Education --      Exclude from Growth Chart --    No data found.  Updated Vital Signs BP 139/87 (BP Location: Left Arm)   Pulse 78   Temp 98.7 F (37.1 C) (Oral)   Resp 16   SpO2 98%   Visual Acuity Right Eye Distance:   Left Eye Distance:   Bilateral Distance:    Right Eye Near:   Left Eye Near:    Bilateral Near:     Physical Exam Vitals reviewed.  Musculoskeletal:        General: Swelling and tenderness present.  Skin:    General: Skin is warm.  Neurological:     General: No focal deficit present.      UC Treatments / Results  Labs (all labs ordered are listed, but only abnormal results are displayed) Labs Reviewed - No data to display  EKG   Radiology No results found.  Procedures Procedures (including critical care time)  Medications Ordered in UC Medications - No data to display  Initial Impression / Assessment and Plan / UC Course  I have reviewed the triage vital signs and the nursing notes.  Pertinent labs & imaging results that were available during my care of the patient were reviewed by me and considered in my medical decision making (see chart for details).     Patient has a history of gout.  He appears to have gout currently.  Patient is given a prescription for colchicine  Final Clinical Impressions(s) / UC Diagnoses   Final diagnoses:  Chronic gout of left foot, unspecified cause     Discharge Instructions      Return if any problems.   ED  Prescriptions     Medication Sig Dispense Auth. Provider   colchicine  0.6 MG tablet Take  1 tablet (0.6 mg total) by mouth 2 (two) times daily. 30 tablet Garnet Overfield K, PA-C      PDMP not reviewed this encounter. An After Visit Summary was printed and given to the patient.       Flint Sonny POUR, PA-C 12/09/23 1311

## 2023-12-24 ENCOUNTER — Telehealth: Payer: Self-pay

## 2023-12-24 NOTE — Telephone Encounter (Signed)
 Copied from CRM [id number]. Topic: General - Other >> Dec 24, 2023 12:58 PM Fonda T wrote: Reason for CRM: Received call from Tawni Forbes, [facility] Department, Nurse Case manager, calling on behalf of patient.   Calling to check status of multiple faxed request for patient, for nutritional supplement, Ensure, to maintain weight. States faxed were sent to office on 12/04/23, and refaxed again on 12/22/23.  Christina requesting a return faxed response or call to complete request.  Tawni can be reached at [phone]. Or can fax completed signed form back to fax # [phone].   Suzen Shove   CMA II

## 2023-12-25 NOTE — Telephone Encounter (Signed)
 This has been faxed back. KH

## 2023-12-29 ENCOUNTER — Other Ambulatory Visit: Payer: Self-pay | Admitting: Nurse Practitioner

## 2023-12-29 DIAGNOSIS — E039 Hypothyroidism, unspecified: Secondary | ICD-10-CM

## 2024-01-11 ENCOUNTER — Other Ambulatory Visit: Payer: Self-pay | Admitting: Nurse Practitioner

## 2024-01-11 DIAGNOSIS — I10 Essential (primary) hypertension: Secondary | ICD-10-CM

## 2024-01-12 ENCOUNTER — Other Ambulatory Visit: Payer: Self-pay | Admitting: Nurse Practitioner

## 2024-01-12 DIAGNOSIS — E039 Hypothyroidism, unspecified: Secondary | ICD-10-CM

## 2024-01-19 ENCOUNTER — Ambulatory Visit: Payer: Self-pay | Admitting: Nurse Practitioner

## 2024-01-20 ENCOUNTER — Ambulatory Visit: Payer: Self-pay | Admitting: Nurse Practitioner

## 2024-01-25 ENCOUNTER — Ambulatory Visit (INDEPENDENT_AMBULATORY_CARE_PROVIDER_SITE_OTHER): Payer: Self-pay | Admitting: Nurse Practitioner

## 2024-01-25 ENCOUNTER — Encounter: Payer: Self-pay | Admitting: Nurse Practitioner

## 2024-01-25 VITALS — BP 142/79 | HR 72 | Temp 97.7°F | Wt 123.0 lb

## 2024-01-25 DIAGNOSIS — G4700 Insomnia, unspecified: Secondary | ICD-10-CM

## 2024-01-25 DIAGNOSIS — M79671 Pain in right foot: Secondary | ICD-10-CM | POA: Diagnosis not present

## 2024-01-25 DIAGNOSIS — I10 Essential (primary) hypertension: Secondary | ICD-10-CM

## 2024-01-25 DIAGNOSIS — M79672 Pain in left foot: Secondary | ICD-10-CM

## 2024-01-25 DIAGNOSIS — M109 Gout, unspecified: Secondary | ICD-10-CM | POA: Diagnosis not present

## 2024-01-25 DIAGNOSIS — E039 Hypothyroidism, unspecified: Secondary | ICD-10-CM

## 2024-01-25 MED ORDER — LEVOTHYROXINE SODIUM 50 MCG PO TABS: 50.0000 ug | ORAL_TABLET | Freq: Every day | ORAL | 0 refills | Status: DC

## 2024-01-25 MED ORDER — HYDROCHLOROTHIAZIDE 12.5 MG PO CAPS: 12.5000 mg | ORAL_CAPSULE | Freq: Every day | ORAL | 0 refills | Status: AC

## 2024-01-25 MED ORDER — TRAZODONE HCL 50 MG PO TABS: 25.0000 mg | ORAL_TABLET | Freq: Every evening | ORAL | 11 refills | Status: AC | PRN

## 2024-01-25 MED ORDER — LISINOPRIL 20 MG PO TABS: 20.0000 mg | ORAL_TABLET | Freq: Every day | ORAL | 0 refills | Status: DC

## 2024-01-25 MED ORDER — COLCHICINE 0.6 MG PO TABS: 0.6000 mg | ORAL_TABLET | Freq: Two times a day (BID) | ORAL | 1 refills | Status: DC

## 2024-01-25 NOTE — Progress Notes (Signed)
 Subjective   Patient ID: Shawn Casey, male    DOB: [birth year], 68 y.o.   MRN: [id number]  Chief Complaint  Patient presents with   Medical Management of Chronic Issues    Referring provider: Oley Bascom RAMAN, NP  Keller Cleven Deziel is a 68 y.o. male with Past Medical History: No date: Anxiety and depression No date: Chronic low back pain with right-sided sciatica No date: Hyperglycemia 02/2020: Hyperlipidemia No date: Hypertension No date: Hypothyroidism No date: Insomnia No date: Memory loss 02/2020: Thrombocytopenia (HCC)   HPI  Patient presents today for follow-up visit.  Overall he has been doing good other than current gout flare to his left foot and ankle.  We will restart colchicine .  We will check labs today.  Patient does need refills on medications today. Denies f/c/s, n/v/d, hemoptysis, PND, leg swelling Denies chest pain or edema    No Known Allergies  Immunization History  Administered Date(s) Administered   Fluad Trivalent(High Dose 65+) 04/20/2023   Influenza,inj,Quad PF,6+ Mos 04/15/2021   PFIZER(Purple Top)SARS-COV-2 Vaccination 08/25/2019, 09/13/2019   Pneumococcal Polysaccharide-23 10/15/2022    Tobacco History: Social History   Tobacco Use  Smoking Status Former   Average packs/day: 0.5 packs/day for 29.0 years (14.5 ttl pk-yrs)   Types: Cigarettes   Start date: 06/1968  Smokeless Tobacco Never   Counseling given: Not Answered   Outpatient Encounter Medications as of 01/25/2024  Medication Sig   albuterol  (VENTOLIN  HFA) 108 (90 Base) MCG/ACT inhaler Inhale 2 puffs into the lungs every 6 (six) hours as needed for wheezing or shortness of breath.   busPIRone  (BUSPAR ) 15 MG tablet Take 1 tablet (15 mg total) by mouth 2 (two) times daily.   cetirizine  (ZYRTEC ) 10 MG tablet Take 1 tablet (10 mg total) by mouth daily.   levothyroxine  (SYNTHROID ) 50 MCG tablet TAKE 1 TABLET BY MOUTH ONCE DAILY BEFORE BREAKFAST   lisinopril   (ZESTRIL ) 20 MG tablet Take 1 tablet by mouth once daily   naproxen  (NAPROSYN ) 500 MG tablet Take 1 tablet by mouth twice daily as needed   rosuvastatin  (CRESTOR ) 5 MG tablet Take 1 tablet (5 mg total) by mouth daily.   sertraline  (ZOLOFT ) 100 MG tablet Take 1 tablet (100 mg total) by mouth daily.   [DISCONTINUED] colchicine  0.6 MG tablet Take 1 tablet (0.6 mg total) by mouth 2 (two) times daily.   [DISCONTINUED] hydrochlorothiazide  (MICROZIDE ) 12.5 MG capsule Take 1 capsule by mouth once daily   colchicine  0.6 MG tablet Take 1 tablet (0.6 mg total) by mouth 2 (two) times daily.   hydrochlorothiazide  (MICROZIDE ) 12.5 MG capsule Take 1 capsule (12.5 mg total) by mouth daily.   traZODone  (DESYREL ) 50 MG tablet Take 0.5-1 tablets (25-50 mg total) by mouth at bedtime as needed for sleep. (Patient not taking: Reported on 12/09/2023)   No facility-administered encounter medications on file as of 01/25/2024.    Review of Systems  Review of Systems  Constitutional: Negative.   HENT: Negative.    Cardiovascular: Negative.   Gastrointestinal: Negative.   Allergic/Immunologic: Negative.   Neurological: Negative.   Psychiatric/Behavioral: Negative.       Objective:   BP (!) 142/79   Pulse 72   Temp 97.7 F (36.5 C) (Oral)   Wt 123 lb (55.8 kg)   SpO2 94%   BMI 24.02 kg/m   Wt Readings from Last 5 Encounters:  01/25/24 123 lb (55.8 kg)  08/19/23 126 lb 9.6 oz (57.4 kg)  07/22/23 128 lb 6.4 oz (  58.2 kg)  04/20/23 127 lb (57.6 kg)  10/15/22 120 lb 9.6 oz (54.7 kg)     Physical Exam Vitals and nursing note reviewed.  Constitutional:      General: He is not in acute distress.    Appearance: He is well-developed.  Cardiovascular:     Rate and Rhythm: Normal rate and regular rhythm.  Pulmonary:     Effort: Pulmonary effort is normal.     Breath sounds: Normal breath sounds.  Skin:    General: Skin is warm and dry.  Neurological:     Mental Status: He is alert and oriented to  person, place, and time.       Assessment & Plan:   Acute gout of left foot, unspecified cause -     CBC -     Comprehensive metabolic panel with GFR -     Uric acid -     Ambulatory referral to Podiatry  Essential hypertension -     hydroCHLOROthiazide ; Take 1 capsule (12.5 mg total) by mouth daily.  Dispense: 90 capsule; Refill: 0  Bilateral foot pain -     Ambulatory referral to Podiatry  Other orders -     Colchicine ; Take 1 tablet (0.6 mg total) by mouth 2 (two) times daily.  Dispense: 30 tablet; Refill: 1     Return in about 6 months (around 07/27/2024).   Bascom GORMAN Borer, NP 01/25/2024

## 2024-01-25 NOTE — Patient Instructions (Signed)
 Low-Purine Diet A low-purine diet involves making choices to limit how much purine you take in. Purine is a kind of uric acid. If you have too much uric acid in your blood, it can cause problems like gout and kidney stones. Eating a low-purine diet can help control those problems. What are tips for following this plan? Shopping Avoid buying things that have high-fructose corn syrup in them. Check for this on food labels. Be sure to check for it in: [employer], such as cookies. Canned fruits. Cereals and cereal bars. Avoid buying meats with a high purine content. These include: Veal. Chicken breast with skin. Lamb. Organ meats, such as liver. Choose dairy products. These may lower uric acid levels. Avoid buying certain types of fish. These include: Anchovies. Trout. Tuna. Sardines. Salmon. Avoid buying drinks with alcohol in them. Alcohol can affect the way your body gets rid of uric acid. Stay away from: [facility]. Hard liquor. Meal planning  Learn which foods do or don't affect you. If you find a food that causes problems, avoid eating that food. If you have any questions about a food item, talk with your health care provider. Eat less meat. When you do eat meat, choose meats that have less purine in them. Eat lots of fruits and vegetables. Even though some vegetables have more purine in them, they don't add as much to the amount of uric acid in your blood as other foods. Have at least one serving of dairy a day. General information If you drink alcohol: Limit how much you have to: 0-1 drink a day if you're male. 0-2 drinks a day if you're male. Know how much alcohol is in your drink. In the [place] one drink is one 12 oz bottle of beer (355 mL), one 5 oz glass of wine (148 mL), or one 1 oz glass of hard liquor (44 mL). Drink more fluids as told. Fluids can help remove uric acid from your body. Work with your provider to make a plan to help you lose weight or stay at a healthy  weight. Losing weight can help lower how much uric acid is in your blood. What foods are recommended? You can have: Fresh or frozen fruits and vegetables. Whole grains, breads, cereals, and pasta. Rice. Beans, peas, and legumes. Nuts and seeds. Dairy products. Fats and oils. The items listed above may not be all the foods and drinks you can have. Talk with an expert in healthy eating called a dietitian to learn more. What foods are not recommended? Limit your intake of foods and drinks high in purines. These include: Beer and other types of alcohol. Meat-based gravy or sauce. Canned or fresh seafood, such as: Anchovies, sardines, herring, salmon, and tuna. Mussels, scallops, shrimp, and crab. Codfish, trout, and haddock. Bacon, veal, chicken breast with skin, and lamb. Organ meats, such as: Liver or kidney. Tripe. Sweetbreads (thymus gland or pancreas). Wild Education officer, environmental. Yeast or yeast extract supplements. Drinks that have been made sweeter with high-fructose corn syrup. These include sodas. Processed foods made with high-fructose corn syrup. The items listed above may not be all the foods and drinks you should limit. Talk with a dietitian to learn more. This information is not intended to replace advice given to you by your health care provider. Make sure you discuss any questions you have with your health care provider. Document Revised: 08/29/2023 Document Reviewed: 08/29/2023 Elsevier Patient Education  2025 [employer].

## 2024-01-26 LAB — CBC
Hematocrit: 37.1 % — ABNORMAL LOW (ref 37.5–51.0)
Hemoglobin: 11.6 g/dL — ABNORMAL LOW (ref 13.0–17.7)
MCH: 26.2 pg — ABNORMAL LOW (ref 26.6–33.0)
MCHC: 31.3 g/dL — ABNORMAL LOW (ref 31.5–35.7)
MCV: 84 fL (ref 79–97)
Platelets: 173 x10E3/uL (ref 150–450)
RBC: 4.43 x10E6/uL (ref 4.14–5.80)
RDW: 12.5 % (ref 11.6–15.4)
WBC: 4.2 x10E3/uL (ref 3.4–10.8)

## 2024-01-26 LAB — COMPREHENSIVE METABOLIC PANEL WITH GFR
ALT: 34 IU/L (ref 0–44)
AST: 28 IU/L (ref 0–40)
Albumin: 3.8 g/dL — ABNORMAL LOW (ref 3.9–4.9)
Alkaline Phosphatase: 96 IU/L (ref 44–121)
BUN/Creatinine Ratio: 12 (ref 10–24)
BUN: 17 mg/dL (ref 8–27)
Bilirubin Total: 0.5 mg/dL (ref 0.0–1.2)
CO2: 24 mmol/L (ref 20–29)
Calcium: 8.9 mg/dL (ref 8.6–10.2)
Chloride: 104 mmol/L (ref 96–106)
Creatinine, Ser: 1.37 mg/dL — ABNORMAL HIGH (ref 0.76–1.27)
Globulin, Total: 3.1 g/dL (ref 1.5–4.5)
Glucose: 135 mg/dL — ABNORMAL HIGH (ref 70–99)
Potassium: 4.2 mmol/L (ref 3.5–5.2)
Sodium: 142 mmol/L (ref 134–144)
Total Protein: 6.9 g/dL (ref 6.0–8.5)
eGFR: 55 mL/min/1.73 — ABNORMAL LOW (ref >59)

## 2024-01-26 LAB — URIC ACID: Uric Acid: 7.1 mg/dL (ref 3.8–8.4)

## 2024-01-26 LAB — TSH: TSH: 1.68 u[IU]/mL (ref 0.450–4.500)

## 2024-01-28 ENCOUNTER — Encounter: Payer: Self-pay | Admitting: Podiatry

## 2024-01-28 ENCOUNTER — Ambulatory Visit (INDEPENDENT_AMBULATORY_CARE_PROVIDER_SITE_OTHER)

## 2024-01-28 ENCOUNTER — Ambulatory Visit: Payer: Self-pay | Admitting: Nurse Practitioner

## 2024-01-28 ENCOUNTER — Ambulatory Visit (INDEPENDENT_AMBULATORY_CARE_PROVIDER_SITE_OTHER): Admitting: Podiatry

## 2024-01-28 VITALS — Ht 60.0 in | Wt 123.0 lb

## 2024-01-28 DIAGNOSIS — M10371 Gout due to renal impairment, right ankle and foot: Secondary | ICD-10-CM

## 2024-01-28 DIAGNOSIS — M109 Gout, unspecified: Secondary | ICD-10-CM | POA: Diagnosis not present

## 2024-01-28 DIAGNOSIS — M10372 Gout due to renal impairment, left ankle and foot: Secondary | ICD-10-CM | POA: Diagnosis not present

## 2024-01-28 MED ORDER — METHYLPREDNISOLONE 4 MG PO TBPK: ORAL_TABLET | ORAL | 0 refills | Status: DC

## 2024-01-28 NOTE — Progress Notes (Signed)
 Chief Complaint  Patient presents with   Foot Pain    Gout Pain R heel , L dorsal meta. L is more painful now 3.  No calluses Pre diabetic. No anti coag .colchicine  is helping    HPI: 68 y.o. male presents today with bilateral ankle pain, left dorsal forefoot pain.  He is accompanied by son who helps assist with interpretation as patient speaks [place].  Was recently seen in urgent care and then PCP and was diagnosed with gout.  Right ankle began bothering the patient a little over a month ago, left ankle more recently within the past week or 2 along with the left dorsal forefoot.  He was last prescribed colchicine  which has been helpful.  Past Medical History:  Diagnosis Date   Anxiety and depression    Chronic low back pain with right-sided sciatica    Hyperglycemia    Hyperlipidemia 02/2020   Hypertension    Hypothyroidism    Insomnia    Memory loss    Thrombocytopenia (HCC) 02/2020    History reviewed. No pertinent surgical history.  No Known Allergies  ROS    Physical Exam: There were no vitals filed for this visit.  General: The patient is alert and oriented x3 in no acute distress.  Dermatology: Skin is warm, dry and supple bilateral lower extremities. Interspaces are clear of maceration and debris.    Vascular: Palpable pedal pulses bilaterally. Capillary refill within normal limits.  No diffuse appreciable edema.  No erythema or calor.  Neurological: Light touch sensation grossly intact bilateral feet.   Musculoskeletal Exam: Left greater than right there is tenderness on palpation with ankle joint range of motion and direct palpation of the ankle structures anterior ER and medially.  Also tenderness on palpation of the left second MPJ dorsal plantar without significant pain with second MPJ range of motion.  Muscle strength within normal limits.  Radiographic Exam: Left and right foot 3 views weightbearing 01/28/2024 Normal osseous mineralization. Joint  spaces preserved.  No fractures or osseous irregularities noted.  Small os tibial externum noted bilaterally. Assessment/Plan of Care: 1. Acute gout due to renal impairment involving left ankle   2. Acute gout due to renal impairment involving right ankle   Uric acid 8/18: 7.1 Creatinine: 1.37 eGFR: 55 WBC: 4.2  Meds ordered this encounter  Medications   methylPREDNISolone  (MEDROL  DOSEPAK) 4 MG TBPK tablet    Sig: 6 Day Tapering Dose    Dispense:  21 tablet    Refill:  0   None  Discussed clinical findings with patient today.  Radiographs reviewed with patient. Agree with overall assessment of gout with sequential flares affecting the ankles right followed by the left.  He has been on colchicine  which has been helpful for his pain.  Based on his labs, will avoid indomethacin at this point.  Prescribed Medrol  Dosepak given that multiple areas are painful.  Encourage patient to speak with PCP regarding chronic gout medication given multiple flares within the past year.  Do want patient to follow-up as needed and try and get to the office soon as possible if he is experiencing other flare in which case can proceed with corticosteroid injection, ankle x-rays, possible joint aspiration.     Jamorris Ndiaye L. Lamount MAUL, AACFAS [facility]     [address]  [place], [place] [address]                Office [phone]  Fax [phone]

## 2024-03-18 ENCOUNTER — Other Ambulatory Visit: Payer: Self-pay | Admitting: Nurse Practitioner

## 2024-03-18 DIAGNOSIS — E039 Hypothyroidism, unspecified: Secondary | ICD-10-CM

## 2024-04-14 ENCOUNTER — Other Ambulatory Visit: Payer: Self-pay | Admitting: Nurse Practitioner

## 2024-04-14 DIAGNOSIS — E039 Hypothyroidism, unspecified: Secondary | ICD-10-CM

## 2024-04-19 ENCOUNTER — Other Ambulatory Visit: Payer: Self-pay | Admitting: Nurse Practitioner

## 2024-04-19 DIAGNOSIS — E039 Hypothyroidism, unspecified: Secondary | ICD-10-CM

## 2024-04-23 ENCOUNTER — Ambulatory Visit (INDEPENDENT_AMBULATORY_CARE_PROVIDER_SITE_OTHER)

## 2024-04-23 ENCOUNTER — Ambulatory Visit (HOSPITAL_COMMUNITY)
Admission: EM | Admit: 2024-04-23 | Discharge: 2024-04-23 | Disposition: A | Discharge status: Home / Self Care | Attending: Emergency Medicine | Admitting: Emergency Medicine

## 2024-04-23 ENCOUNTER — Encounter (HOSPITAL_COMMUNITY): Payer: Self-pay | Admitting: *Deleted

## 2024-04-23 DIAGNOSIS — M79671 Pain in right foot: Secondary | ICD-10-CM

## 2024-04-23 DIAGNOSIS — M25562 Pain in left knee: Secondary | ICD-10-CM

## 2024-04-23 DIAGNOSIS — W19XXXA Unspecified fall, initial encounter: Secondary | ICD-10-CM

## 2024-04-23 MED ORDER — MELOXICAM 7.5 MG PO TABS: 7.5000 mg | ORAL_TABLET | Freq: Every day | ORAL | 0 refills | Status: AC

## 2024-04-23 NOTE — ED Triage Notes (Addendum)
 Declines interpreter. Son states pt slipped when getting off toilet 2 days ago, falling onto left knee. C/O left knee pain, and right dorsal foot pain since then. Pt is ambulatory, but needing a cane since incident. BLE CMS intact. Left knee noted with edema and erythema and tenderness. Also c/o right distal foot pain near base of great toe.

## 2024-04-23 NOTE — Discharge Instructions (Addendum)
??????? ????-?? ???? ??? ??????????? ?????????? ?? ???????????? ???? ????????? ?? ????? ?? ???? ????? ????? ?? ??? ????????????? ??? ??????????? ????? ?? ???? ?????? ???? ??????? ???????? ?-? ??????? ??? ???? ???? ????????? ???????? ??? ??????????? ? ????? ???? ?????????????? ??? ??????????? ????????? ? ???? ?????? ???? ????? ???????? ?? ????? ? ??????? ??????? ????? ??????????? ????? ? ???????? ?????? ?? ???? ?????? ???-????? ???? ?????????, ??? ?????????? ? ???? ??????????? ??? ??????? ????? ?? ?????????? ? ???????????? ???? ???? ????? ??? ??? ????? ????????? ????????? ??? ???-?? ??????????   Tap?'??k? ?ksa-r? kunai pani antarnihita phry?kcara v? ?isl?k??anak? l?gi nak?r?tmaka cha. Dukh?'i kama garna tap?'?? dinak? ?ka pa?aka m?l?ksiky?ma lina saknuhuncha. Dukh?'i kama garna ?va?yaka bha'?m? tap?'?nl? praty?ka 6-8 gha???m? 500 d?khi 1000 mil?gr?ma ??yal?n?la lina saknuhuncha. 1 Dinam? 4000 mil?gr?mabhand? ba?h? nagarnuh?s. Kampr?sana ra ?r?ma prad?na garna ?phn? khu???m? ?sa ?y?pa ra ghum???m? ghum???k? br?sa lag?'unuh?s. Dukh?'i ra sunnin? samasy? kama garna dinabhari samaya-samayam? ?r?ma garnuh?s, barapha lag?'unuh?s ra m?thi u?h?'unuh?s. Yadi tap?'??k? dukh?'i thapa m?ly??kana ra vyavasth?panak? l?gi j?r? rahy? bhan? k?na h?ltha sp?r?sa m??isinak? s?tha phal?-apa garnuh?s.  Your x-rays are negative for any underlying fracture or dislocation. You can take meloxicam  once daily to help with pain. You can take 500 to 1000 mg of Tylenol  every 6-8 hours as needed for pain.  Do not exceed 4000 mg in 1 day. Wear the Ace wrap on your foot and the knee brace on your knee to help provide compression and comfort. Rest, ice, and elevate the periodically throughout the day to help with pain and swelling. Follow-up with [facility] if your pain continues for further evaluation and management.

## 2024-04-23 NOTE — ED Provider Notes (Signed)
 [facility]    CSN: [id number] Arrival date & time: 04/23/24  1048      History   Chief Complaint Chief Complaint  Patient presents with   Knee Pain   Fall    HPI Shawn Casey is a 68 y.o. male.   Patient presents with son who is interpreting.  Per son patient slipped while getting off the toilet 2 days ago and fell onto his left knee.  Patient is now complaining of left knee pain and right dorsal foot pain since then.  Patient is ambulatory but has had to use a cane since the incident.  Denies hitting his head or loss of consciousness.  Denies any other injuries from this fall.  The history is provided by the patient and medical records. The history is limited by a language barrier. No language interpreter was used (Declined interpreter, son is interpreting).  Knee Pain Fall    Past Medical History:  Diagnosis Date   Anxiety and depression    Chronic low back pain with right-sided sciatica    Hyperglycemia    Hyperlipidemia 02/2020   Hypertension    Hypothyroidism    Insomnia    Memory loss    Thrombocytopenia 02/2020    Patient Active Problem List   Diagnosis Date Noted   History of pneumonia 10/15/2022   Upper respiratory tract infection 09/19/2022   Chronic post-traumatic stress disorder (PTSD) 05/10/2019   Muscle spasm of right shoulder 01/29/2019   Chronic right-sided low back pain with right-sided sciatica 01/29/2019   Essential hypertension 01/29/2019   Vitamin D  deficiency 01/29/2019   Anxiety and depression 01/29/2019   Impaired mobility 01/29/2019   Language barrier 01/29/2019   Cognitive impairment 01/29/2019   PTSD (post-traumatic stress disorder) 02/23/2018   Memory loss    Hypothyroidism    Chest pain 02/23/2014   Urticaria 02/23/2014    History reviewed. No pertinent surgical history.     Home Medications    Prior to Admission medications   Medication Sig Start Date End Date Taking? Authorizing Provider   busPIRone  (BUSPAR ) 15 MG tablet Take 1 tablet (15 mg total) by mouth 2 (two) times daily. 10/14/21  Yes Oley Bascom RAMAN, NP  cetirizine  (ZYRTEC ) 10 MG tablet Take 1 tablet (10 mg total) by mouth daily. 10/15/22  Yes Oley Bascom RAMAN, NP  colchicine  0.6 MG tablet Take 1 tablet by mouth twice daily 03/18/24  Yes Nichols, Tonya S, NP  hydrochlorothiazide  (MICROZIDE ) 12.5 MG capsule Take 1 capsule (12.5 mg total) by mouth daily. 01/25/24  Yes Oley Bascom RAMAN, NP  levothyroxine  (SYNTHROID ) 50 MCG tablet TAKE 1 TABLET BY MOUTH ONCE DAILY BEFORE BREAKFAST 04/14/24  Yes Nichols, Tonya S, NP  lisinopril  (ZESTRIL ) 20 MG tablet Take 1 tablet (20 mg total) by mouth daily. 01/25/24  Yes Oley Bascom RAMAN, NP  meloxicam  (MOBIC ) 7.5 MG tablet Take 1 tablet (7.5 mg total) by mouth daily. 04/23/24  Yes Johnie Flaming A, NP  naproxen  (NAPROSYN ) 500 MG tablet Take 1 tablet by mouth twice daily as needed 12/12/22  Yes Nichols, Tonya S, NP  rosuvastatin  (CRESTOR ) 5 MG tablet Take 1 tablet (5 mg total) by mouth daily. 07/22/23 07/21/24 Yes Oley Bascom RAMAN, NP  sertraline  (ZOLOFT ) 100 MG tablet Take 1 tablet (100 mg total) by mouth daily. 10/14/21  Yes Oley Bascom RAMAN, NP  traZODone  (DESYREL ) 50 MG tablet Take 0.5-1 tablets (25-50 mg total) by mouth at bedtime as needed for sleep. 01/25/24  Yes Nichols, Tonya  S, NP  albuterol  (VENTOLIN  HFA) 108 (90 Base) MCG/ACT inhaler Inhale 2 puffs into the lungs every 6 (six) hours as needed for wheezing or shortness of breath. 08/19/23   Isaiah Scrivener, MD    Family History Family History  Problem Relation Age of Onset   CAD Neg Hx     Social History Social History   Tobacco Use   Smoking status: Former    Average packs/day: 0.5 packs/day for 29.0 years (14.5 ttl pk-yrs)    Types: Cigarettes    Start date: 06/1968   Smokeless tobacco: Never  Vaping Use   Vaping status: Never Used  Substance Use Topics   Alcohol use: No   Drug use: No     Allergies   Patient has no known  allergies.   Review of Systems Review of Systems  Per HPI  Physical Exam Triage Vital Signs ED Triage Vitals  Encounter Vitals Group     BP 04/23/24 1206 114/65     Girls Systolic BP Percentile --      Girls Diastolic BP Percentile --      Boys Systolic BP Percentile --      Boys Diastolic BP Percentile --      Pulse Rate 04/23/24 1206 76     Resp 04/23/24 1206 20     Temp 04/23/24 1206 98.4 F (36.9 C)     Temp Source 04/23/24 1206 Oral     SpO2 04/23/24 1206 95 %     Weight --      Height --      Head Circumference --      Peak Flow --      Pain Score 04/23/24 1208 8     Pain Loc --      Pain Education --      Exclude from Growth Chart --    No data found.  Updated Vital Signs BP 114/65   Pulse 76   Temp 98.4 F (36.9 C) (Oral)   Resp 20   SpO2 95%   Visual Acuity Right Eye Distance:   Left Eye Distance:   Bilateral Distance:    Right Eye Near:   Left Eye Near:    Bilateral Near:     Physical Exam Vitals and nursing note reviewed.  Constitutional:      General: He is awake. He is not in acute distress.    Appearance: Normal appearance. He is well-developed and well-groomed. He is not ill-appearing.  Musculoskeletal:     Left knee: Swelling and erythema present. Tenderness present over the ACL and patellar tendon.     Right foot: Normal range of motion. Swelling and tenderness present.       Feet:     Comments: Tenderness noted over dorsal aspect of right foot particularly to medial midfoot and right great toe. Generalized swelling and erythema noted to left knee with tenderness noted to anterior aspect of knee over ACL and patellar tendon  Skin:    General: Skin is warm and dry.  Neurological:     Mental Status: He is alert.  Psychiatric:        Behavior: Behavior is cooperative.      UC Treatments / Results  Labs (all labs ordered are listed, but only abnormal results are displayed) Labs Reviewed - No data to  display  EKG   Radiology DG Knee AP/LAT W/Sunrise Left Result Date: 04/23/2024 CLINICAL DATA:  Clemens 2 days ago, left knee pain EXAM: LEFT KNEE 3 VIEWS  COMPARISON:  None Available. FINDINGS: Frontal, lateral, and sunrise views of the left knee are obtained. No acute fracture, subluxation, or dislocation. Joint spaces are well preserved. No joint effusion. There is a 4 mm radiopaque foreign body within the posteromedial soft tissues of the left calf, age indeterminate. Otherwise soft tissues are unremarkable. IMPRESSION: 1. No acute displaced fracture. 2. 4 mm radiopaque foreign body within the left calf, age indeterminate. Electronically Signed   By: Ozell Daring M.D.   On: 04/23/2024 12:56   DG Foot Complete Right Result Date: 04/23/2024 CLINICAL DATA:  Clemens, pain and tenderness EXAM: RIGHT FOOT COMPLETE - 3+ VIEW COMPARISON:  01/28/2024 FINDINGS: Frontal, oblique, and lateral views of the right foot are obtained. No acute fracture, subluxation, or dislocation. Joint spaces are well preserved. Soft tissues are unremarkable. IMPRESSION: 1. Unremarkable right foot. Electronically Signed   By: Ozell Daring M.D.   On: 04/23/2024 12:55    Procedures Procedures (including critical care time)  Medications Ordered in UC Medications - No data to display  Initial Impression / Assessment and Plan / UC Course  I have reviewed the triage vital signs and the nursing notes.  Pertinent labs & imaging results that were available during my care of the patient were reviewed by me and considered in my medical decision making (see chart for details).     Patient is overall well-appearing.  Vitals are stable.  X-rays ordered.  I independently interpreted these images and there is no acute osseous abnormality to the knee or the foot.  Radiology report confirms this.  Provided patient with knee brace for knee and Ace wrap for foot to provide compression.  Prescribe meloxicam  as needed for pain.  Discussed  RICE therapy.  Given orthopedic follow-up.  Discussed follow-up and return precautions. Final Clinical Impressions(s) / UC Diagnoses   Final diagnoses:  Fall, initial encounter  Acute pain of left knee  Right foot pain     Discharge Instructions      ??????? ????-?? ???? ??? ??????????? ?????????? ?? ???????????? ???? ????????? ?? ????? ?? ???? ????? ????? ?? ??? ????????????? ??? ??????????? ????? ?? ???? ?????? ???? ??????? ???????? ?-? ??????? ??? ???? ???? ????????? ???????? ??? ??????????? ? ????? ???? ?????????????? ??? ??????????? ????????? ? ???? ?????? ???? ????? ???????? ?? ????? ? ??????? ??????? ????? ??????????? ????? ? ???????? ?????? ?? ???? ?????? ???-????? ???? ?????????, ??? ?????????? ? ???? ??????????? ??? ??????? ????? ?? ?????????? ? ???????????? ???? ???? ????? ??? ??? ????? ????????? ????????? ??? ???-?? ?????????? Tap?'??k? ?ksa-r? kunai pani antarnihita phry?kcara v? ?isl?k??anak? l?gi nak?r?tmaka cha. Dukh?'i kama garna tap?'?? dinak? ?ka pa?aka m?l?ksiky?ma lina saknuhuncha. Dukh?'i kama garna ?va?yaka bha'?m? tap?'?nl? praty?ka 6-8 gha???m? 500 d?khi 1000 mil?gr?ma ??yal?n?la lina saknuhuncha. 1 Dinam? 4000 mil?gr?mabhand? ba?h? nagarnuh?s. Kampr?sana ra ?r?ma prad?na garna ?phn? khu???m? ?sa ?y?pa ra ghum???m? ghum???k? br?sa lag?'unuh?s. Dukh?'i ra sunnin? samasy? kama garna dinabhari samaya-samayam? ?r?ma garnuh?s, barapha lag?'unuh?s ra m?thi u?h?'unuh?s. Yadi tap?'??k? dukh?'i thapa m?ly??kana ra vyavasth?panak? l?gi j?r? rahy? bhan? k?na h?ltha sp?r?sa m??isinak? s?tha phal?-apa garnuh?s.  Your x-rays are negative for any underlying fracture or dislocation. You can take meloxicam  once daily to help with pain. You can take 500 to 1000 mg of Tylenol  every 6-8 hours as needed for pain.  Do not exceed 4000 mg in 1 day. Wear the Ace wrap on your foot and the knee brace on your knee to help provide compression and comfort. Rest, ice, and elevate the  periodically throughout the day to help  with pain and swelling. Follow-up with [facility] if your pain continues for further evaluation and management.     ED Prescriptions     Medication Sig Dispense Auth. Provider   meloxicam  (MOBIC ) 7.5 MG tablet Take 1 tablet (7.5 mg total) by mouth daily. 30 tablet Johnie Flaming A, NP      PDMP not reviewed this encounter.   Johnie Flaming A, NP 04/23/24 1331

## 2024-05-13 ENCOUNTER — Other Ambulatory Visit: Payer: Self-pay

## 2024-05-13 ENCOUNTER — Telehealth: Payer: Self-pay

## 2024-05-13 DIAGNOSIS — E039 Hypothyroidism, unspecified: Secondary | ICD-10-CM

## 2024-05-13 MED ORDER — LEVOTHYROXINE SODIUM 50 MCG PO TABS: 50.0000 ug | ORAL_TABLET | Freq: Every day | ORAL | 0 refills | Status: DC

## 2024-05-13 NOTE — Telephone Encounter (Signed)
 Done [facility]

## 2024-05-13 NOTE — Telephone Encounter (Signed)
 levothyroxine  (SYNTHROID ) 50 MCG tablet [[id number]]

## 2024-05-20 ENCOUNTER — Other Ambulatory Visit: Payer: Self-pay | Admitting: Nurse Practitioner

## 2024-05-20 MED ORDER — COLCHICINE 0.6 MG PO TABS: 0.6000 mg | ORAL_TABLET | Freq: Two times a day (BID) | ORAL | 0 refills | Status: DC

## 2024-05-20 NOTE — Telephone Encounter (Signed)
 Please advise [facility]

## 2024-05-20 NOTE — Telephone Encounter (Signed)
 Copied from CRM #[id number]. Topic: Clinical - Medication Refill >> May 20, 2024 10:44 AM Avram MATSU wrote: Medication: colchicine  0.6 MG tablet [[id number]]  Has the patient contacted their pharmacy? Yes (Agent: If no, request that the patient contact the pharmacy for the refill. If patient does not wish to contact the pharmacy document the reason why and proceed with request.) (Agent: If yes, when and what did the pharmacy advise?)  This is the patient's preferred pharmacy:  [id number] - [place] ([place]), [place] - [address]  ([place]) [place] [address] Phone: [phone] Fax: [phone]  Is this the correct pharmacy for this prescription? Yes If no, delete pharmacy and type the correct one.   Has the prescription been filled recently? No  Is the patient out of the medication? Yes  Has the patient been seen for an appointment in the last year OR does the patient have an upcoming appointment? Yes  Can we respond through MyChart? Yes  Agent: Please be advised that Rx refills may take up to 3 business days. We ask that you follow-up with your pharmacy.

## 2024-05-23 ENCOUNTER — Other Ambulatory Visit: Payer: Self-pay | Admitting: Nurse Practitioner

## 2024-06-09 ENCOUNTER — Other Ambulatory Visit: Payer: Self-pay | Admitting: Nurse Practitioner

## 2024-06-09 DIAGNOSIS — E039 Hypothyroidism, unspecified: Secondary | ICD-10-CM

## 2024-06-10 NOTE — Telephone Encounter (Signed)
 levothyroxine  (SYNTHROID ) 50 MCG tablet [Pharmacy Med Name: Levothyroxine  Sodium 50 MCG Oral Tablet]

## 2024-06-21 ENCOUNTER — Other Ambulatory Visit: Payer: Self-pay

## 2024-06-21 MED ORDER — COLCHICINE 0.6 MG PO TABS: 0.6000 mg | ORAL_TABLET | Freq: Two times a day (BID) | ORAL | 0 refills | Status: AC

## 2024-06-21 NOTE — Telephone Encounter (Signed)
 Please Advise.  CB.

## 2024-07-12 ENCOUNTER — Other Ambulatory Visit: Payer: Self-pay | Admitting: Nurse Practitioner

## 2024-07-12 DIAGNOSIS — E039 Hypothyroidism, unspecified: Secondary | ICD-10-CM

## 2024-07-12 NOTE — Telephone Encounter (Signed)
 levothyroxine  (SYNTHROID ) 50 MCG tablet [Pharmacy Med Name: Levothyroxine  Sodium 50 MCG Oral Tablet]

## 2024-07-27 ENCOUNTER — Ambulatory Visit: Payer: Self-pay | Admitting: Nurse Practitioner
